# Patient Record
Sex: Male | Born: 1985 | Race: Black or African American | Hispanic: No | Marital: Single | Smoking: Never smoker
Health system: Southern US, Community
[De-identification: ages and names within clinical notes are randomized; demographics above are authoritative.]

## PROBLEM LIST (undated history)

## (undated) HISTORY — PX: HERNIA REPAIR: SHX51

---
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
## 2007-05-09 ENCOUNTER — Emergency Department (HOSPITAL_COMMUNITY): Admission: EM | Admit: 2007-05-09 | Discharge: 2007-05-09 | Payer: Self-pay | Admitting: Emergency Medicine

## 2009-03-09 ENCOUNTER — Emergency Department (HOSPITAL_COMMUNITY): Admission: EM | Admit: 2009-03-09 | Discharge: 2009-03-09 | Payer: Self-pay | Admitting: Emergency Medicine

## 2009-06-04 ENCOUNTER — Emergency Department (HOSPITAL_COMMUNITY): Admission: EM | Admit: 2009-06-04 | Discharge: 2009-06-04 | Payer: Self-pay | Admitting: Emergency Medicine

## 2009-09-26 ENCOUNTER — Emergency Department (HOSPITAL_COMMUNITY): Admission: EM | Admit: 2009-09-26 | Discharge: 2009-09-26 | Payer: Self-pay | Admitting: Emergency Medicine

## 2010-05-20 LAB — GC/CHLAMYDIA PROBE AMP, GENITAL
Chlamydia, DNA Probe: NEGATIVE
GC Probe Amp, Genital: NEGATIVE

## 2010-05-20 LAB — RPR: RPR Ser Ql: NONREACTIVE

## 2010-07-17 NOTE — Op Note (Signed)
[place]. [facility]  Patient:    Howard Hamilton, Howard Hamilton Visit Number: [id number] MRN: [id number]          Service Type: DSU Location: [facility] Attending Physician:  Leonia Corona Dictated by:   Judie Petit. Leonia Corona, M.D. Proc. Date: 09/07/01 Admit Date:  09/07/2001 Discharge Date: 09/07/2001                             Operative Report  PREOPERATIVE DIAGNOSIS:  Umbilical hernia.  POSTOPERATIVE DIAGNOSIS:  Umbilical hernia.  PROCEDURE PERFORMED:  Repair of umbilical hernia.  ANESTHESIA:  General laryngeal mask.  SURGEON:  Nelida Meuse, M.D.  ASSISTANT:  Nurse.  PROCEDURE IN DETAIL:  The patient is brought to the operating room and placed supine on the operating table.  General laryngeal mask anesthesia is given. The umbilicus and surrounding area was cleaned, prepped and draped in the usual manner.  Approximately 5 cc of 0.25% Marcaine with epinephrine was infiltrated along the line the incision and his umbilical area.  The incision was made with a knife, measuring about 1-2 cm.  The center of the umbilical skin was held up with a Towe, fascillitating the incision and further subsequent dissection into subcutaneous plane.  With the help of scissors, sharp and blunt dissection was done around the umbilical sac, going around circumferentially. We were able to pass the hemostats on one side of the hernial sac to the opposite, running above the sac.  The sac was opened and edges of the hernia divided and the sac was held up with multiple hemostats.  A large defect measuring more than 2 cm was found.  The hernia sac was dissected in the subcu plane until the umbilical ring, at which point it was repaired using 4-0 stainless steel wire.  A transverse mattress suture was placed after tying, a vent secured inverted this area was obtained.  Was further secured using 2-0 Vicryl.  Stitches were placed in between the stainless steel wire sutures. Oozing and  bleeding spots were cauterized.  The distal part of the sac still attached to the undersurface of the umbilical skin was excised completely with the help of sharp scissors.  Oozing and bleeding spots were cauterized.  The wound was irrigated and then the umbilicus was recreated by tucking the center of the umbilicus into the center of the fascial repair using 00 Vicryl. Further sites were infiltrated with 0.25% Marcaine with epinephrine in and around the incision.  Postoperative in control.  The wound was now closed in two layers.  The deep subcutaneous layer using 4-0 Vicryl subcuticular stitches and 5-0 Monocryl subcuticular stitch.  Steri-Strips were applied. This was followed with a sterile gauze and tape.  The patient tolerated the procedure well.  The patient was extubated and transported to recovery room in good condition. Dictated by:   Judie Petit. Leonia Corona, M.D. Attending Physician:  Leonia Corona DD:  09/07/01 TD:  09/10/01 Job: [id number]

## 2013-01-01 ENCOUNTER — Emergency Department (HOSPITAL_COMMUNITY)
Admission: EM | Admit: 2013-01-01 | Discharge: 2013-01-01 | Disposition: A | Discharge status: Home / Self Care | Payer: Self-pay | Attending: Emergency Medicine | Admitting: Emergency Medicine

## 2013-01-01 ENCOUNTER — Encounter (HOSPITAL_COMMUNITY): Payer: Self-pay | Admitting: Emergency Medicine

## 2013-01-01 DIAGNOSIS — K029 Dental caries, unspecified: Secondary | ICD-10-CM | POA: Insufficient documentation

## 2013-01-01 DIAGNOSIS — F17200 Nicotine dependence, unspecified, uncomplicated: Secondary | ICD-10-CM | POA: Insufficient documentation

## 2013-01-01 MED ORDER — TRAMADOL HCL 50 MG PO TABS: 50.0000 mg | ORAL_TABLET | Freq: Four times a day (QID) | ORAL | Status: DC | PRN

## 2013-01-01 NOTE — ED Provider Notes (Signed)
CSN: [id number]     Arrival date & time 01/01/13  1849 History   First MD Initiated Contact with Patient 01/01/13 2002     Chief Complaint  Patient presents with  . Dental Pain   (Consider location/radiation/quality/duration/timing/severity/associated sxs/prior Treatment) HPI Comments: Patient revisit presents with right lower molar pain for 2 months yesterday portion of the tooth is chipped off.  He's had increased pain since that, time.  He's been taking ibuprofen, and Tylenol with moderate relief.  Has no dentist.  Has no dental insurance  Patient is a 27 y.o. male presenting with tooth pain. The history is provided by the patient.  Dental Pain Location:  Lower Lower teeth location:  31/RL 2nd molar Quality:  Aching and constant Severity:  Moderate Onset quality:  Gradual Timing:  Constant Progression:  Worsening Chronicity:  New Context: crown fracture   Relieved by:  NSAIDs Worsened by:  Cold food/drink Associated symptoms: no facial pain, no facial swelling, no fever, no gum swelling, no headaches, no oral lesions and no trismus   Risk factors: no diabetes and no periodontal disease     History reviewed. No pertinent past medical history. History reviewed. No pertinent past surgical history. No family history on file. History  Substance Use Topics  . Smoking status: Current Every Day Smoker  . Smokeless tobacco: Not on file  . Alcohol Use: Yes    Review of Systems  Constitutional: Negative for fever.  HENT: Positive for dental problem. Negative for facial swelling and mouth sores.   Neurological: Negative for headaches.  All other systems reviewed and are negative.    Allergies  Review of patient's allergies indicates no known allergies.  Home Medications   Current Outpatient Rx  Name  Route  Sig  Dispense  Refill  . traMADol (ULTRAM) 50 MG tablet   Oral   Take 1 tablet (50 mg total) by mouth every 6 (six) hours as needed for pain.   15 tablet   0     BP 135/78  Pulse 56  Temp(Src) 98 F (36.7 C) (Oral)  Resp 16  Wt 184 lb 8 oz (83.689 kg)  SpO2 100% Physical Exam  Nursing note and vitals reviewed. Constitutional: He appears well-developed and well-nourished.  HENT:  Head: Normocephalic.  Mouth/Throat:    Eyes: Pupils are equal, round, and reactive to light.  Neck: Normal range of motion.  Cardiovascular: Normal rate and regular rhythm.   Lymphadenopathy:    He has no cervical adenopathy.  Neurological: He is alert.  Skin: Skin is warm.    ED Course  Dental Date/Time: 01/01/2013 8:17 PM Performed by: Arman Filter Authorized by: Arman Filter Consent: Verbal consent obtained. written consent not obtained. Risks and benefits: risks, benefits and alternatives were discussed Consent given by: patient Patient understanding: patient states understanding of the procedure being performed Patient identity confirmed: verbally with patient Time out: Immediately prior to procedure a "time out" was called to verify the correct patient, procedure, equipment, support staff and site/side marked as required. Local anesthesia used: yes Local anesthetic: bupivacaine 0.5% with epinephrine Anesthetic total: 0.5 ml Patient sedated: no Patient tolerance: Patient tolerated the procedure well with no immediate complications.   (including critical care time) Labs Review Labs Reviewed - No data to display Imaging Review No results found.  EKG Interpretation   None       MDM   1. Dental caries    Patient did get relief from his dental, block.  He's  been referred to a dentist for followup in the morning.  He's also been given a prescription for Ultram, and instructed to start taking the medication when he starts feeling.  The tingling when the, medication wears off     Arman Filter, NP 01/01/13 2039

## 2013-01-01 NOTE — ED Notes (Signed)
Patient presents with right lower molar pain x 2 months and reports part of the molar chipped off yesterday.  Patient has not seen a dentist because he does not have insurance.  Patient reports taking ibuprofen and tylenol with temporary relief pain.

## 2013-01-01 NOTE — ED Provider Notes (Signed)
Medical screening examination/treatment/procedure(s) were performed by non-physician practitioner and as supervising physician I was immediately available for consultation/collaboration.  Flint Melter, MD 01/01/13 2209

## 2014-02-05 ENCOUNTER — Encounter (HOSPITAL_COMMUNITY): Payer: Self-pay | Admitting: *Deleted

## 2014-02-05 ENCOUNTER — Emergency Department (HOSPITAL_COMMUNITY): Payer: Self-pay

## 2014-02-05 ENCOUNTER — Emergency Department (HOSPITAL_COMMUNITY)
Admission: EM | Admit: 2014-02-05 | Discharge: 2014-02-05 | Disposition: A | Discharge status: Home / Self Care | Payer: Self-pay | Attending: Emergency Medicine | Admitting: Emergency Medicine

## 2014-02-05 DIAGNOSIS — Y998 Other external cause status: Secondary | ICD-10-CM | POA: Insufficient documentation

## 2014-02-05 DIAGNOSIS — S0990XA Unspecified injury of head, initial encounter: Secondary | ICD-10-CM | POA: Insufficient documentation

## 2014-02-05 DIAGNOSIS — S0591XA Unspecified injury of right eye and orbit, initial encounter: Secondary | ICD-10-CM | POA: Insufficient documentation

## 2014-02-05 DIAGNOSIS — Y9339 Activity, other involving climbing, rappelling and jumping off: Secondary | ICD-10-CM | POA: Insufficient documentation

## 2014-02-05 DIAGNOSIS — Z720 Tobacco use: Secondary | ICD-10-CM | POA: Insufficient documentation

## 2014-02-05 DIAGNOSIS — S022XXA Fracture of nasal bones, initial encounter for closed fracture: Secondary | ICD-10-CM | POA: Insufficient documentation

## 2014-02-05 DIAGNOSIS — Y9289 Other specified places as the place of occurrence of the external cause: Secondary | ICD-10-CM | POA: Insufficient documentation

## 2014-02-05 MED ORDER — HYDROCODONE-ACETAMINOPHEN 5-325 MG PO TABS: 1.0000 | ORAL_TABLET | ORAL | Status: DC | PRN

## 2014-02-05 MED ORDER — HYDROCODONE-ACETAMINOPHEN 5-325 MG PO TABS
1.0000 | ORAL_TABLET | Freq: Once | ORAL | Status: AC
Start: 2014-02-05 — End: 2014-02-05
  Administered 2014-02-05: 1 via ORAL
  Filled 2014-02-05: qty 1
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
## 2014-02-05 NOTE — ED Notes (Signed)
Pt back from CT

## 2014-02-05 NOTE — Discharge Instructions (Signed)
Concussion A concussion, or closed-head injury, is a brain injury caused by a direct blow to the head or by a quick and sudden movement (jolt) of the head or neck. Concussions are usually not life-threatening. Even so, the effects of a concussion can be serious. If you have had a concussion before, you are more likely to experience concussion-like symptoms after a direct blow to the head.  CAUSES  Direct blow to the head, such as from running into another player during a soccer game, being hit in a fight, or hitting your head on a hard surface.  A jolt of the head or neck that causes the brain to move back and forth inside the skull, such as in a car crash. SIGNS AND SYMPTOMS The signs of a concussion can be hard to notice. Early on, they may be missed by you, family members, and health care providers. You may look fine but act or feel differently. Symptoms are usually temporary, but they may last for days, weeks, or even longer. Some symptoms may appear right away while others may not show up for hours or days. Every head injury is different. Symptoms include:  Mild to moderate headaches that will not go away.  A feeling of pressure inside your head.  Having more trouble than usual:  Learning or remembering things you have heard.  Answering questions.  Paying attention or concentrating.  Organizing daily tasks.  Making decisions and solving problems.  Slowness in thinking, acting or reacting, speaking, or reading.  Getting lost or being easily confused.  Feeling tired all the time or lacking energy (fatigued).  Feeling drowsy.  Sleep disturbances.  Sleeping more than usual.  Sleeping less than usual.  Trouble falling asleep.  Trouble sleeping (insomnia).  Loss of balance or feeling lightheaded or dizzy.  Nausea or vomiting.  Numbness or tingling.  Increased sensitivity to:  Sounds.  Lights.  Distractions.  Vision problems or eyes that tire  easily.  Diminished sense of taste or smell.  Ringing in the ears.  Mood changes such as feeling sad or anxious.  Becoming easily irritated or angry for little or no reason.  Lack of motivation.  Seeing or hearing things other people do not see or hear (hallucinations). DIAGNOSIS Your health care provider can usually diagnose a concussion based on a description of your injury and symptoms. He or she will ask whether you passed out (lost consciousness) and whether you are having trouble remembering events that happened right before and during your injury. Your evaluation might include:  A brain scan to look for signs of injury to the brain. Even if the test shows no injury, you may still have a concussion.  Blood tests to be sure other problems are not present. TREATMENT  Concussions are usually treated in an emergency department, in urgent care, or at a clinic. You may need to stay in the hospital overnight for further treatment.  Tell your health care provider if you are taking any medicines, including prescription medicines, over-the-counter medicines, and natural remedies. Some medicines, such as blood thinners (anticoagulants) and aspirin, may increase the chance of complications. Also tell your health care provider whether you have had alcohol or are taking illegal drugs. This information may affect treatment.  Your health care provider will send you home with important instructions to follow.  How fast you will recover from a concussion depends on many factors. These factors include how severe your concussion is, what part of your brain was injured, your  age, and how healthy you were before the concussion. °· Most people with mild injuries recover fully. Recovery can take time. In general, recovery is slower in older persons. Also, persons who have had a concussion in the past or have other medical problems may find that it takes longer to recover from their current injury. °HOME  CARE INSTRUCTIONS °General Instructions °· Carefully follow the directions your health care provider gave you. °· Only take over-the-counter or prescription medicines for pain, discomfort, or fever as directed by your health care provider. °· Take only those medicines that your health care provider has approved. °· Do not drink alcohol until your health care provider says you are well enough to do so. Alcohol and certain other drugs may slow your recovery and can put you at risk of further injury. °· If it is harder than usual to remember things, write them down. °· If you are easily distracted, try to do one thing at a time. For example, do not try to watch TV while fixing dinner. °· Talk with family members or close friends when making important decisions. °· Keep all follow-up appointments. Repeated evaluation of your symptoms is recommended for your recovery. °· Watch your symptoms and tell others to do the same. Complications sometimes occur after a concussion. Older adults with a brain injury may have a higher risk of serious complications, such as a blood clot on the brain. °· Tell your teachers, school nurse, school counselor, coach, athletic trainer, or work manager about your injury, symptoms, and restrictions. Tell them about what you can or cannot do. They should watch for: °¨ Increased problems with attention or concentration. °¨ Increased difficulty remembering or learning new information. °¨ Increased time needed to complete tasks or assignments. °¨ Increased irritability or decreased ability to cope with stress. °¨ Increased symptoms. °· Rest. Rest helps the brain to heal. Make sure you: °¨ Get plenty of sleep at night. Avoid staying up late at night. °¨ Keep the same bedtime hours on weekends and weekdays. °¨ Rest during the day. Take daytime naps or rest breaks when you feel tired. °· Limit activities that require a lot of thought or concentration. These include: °¨ Doing homework or job-related  work. °¨ Watching TV. °¨ Working on the computer. °· Avoid any situation where there is potential for another head injury (football, hockey, soccer, basketball, martial arts, downhill snow sports and horseback riding). Your condition will get worse every time you experience a concussion. You should avoid these activities until you are evaluated by the appropriate follow-up health care providers. °Returning To Your Regular Activities °You will need to return to your normal activities slowly, not all at once. You must give your body and brain enough time for recovery. °· Do not return to sports or other athletic activities until your health care provider tells you it is safe to do so. °· Ask your health care provider when you can drive, ride a bicycle, or operate heavy machinery. Your ability to react may be slower after a brain injury. Never do these activities if you are dizzy. °· Ask your health care provider about when you can return to work or school. °Preventing Another Concussion °It is very important to avoid another brain injury, especially before you have recovered. In rare cases, another injury can lead to permanent brain damage, brain swelling, or death. The risk of this is greatest during the first 7-10 days after a head injury. Avoid injuries by: °· Wearing a seat   belt when riding in a car.  Drinking alcohol only in moderation.  Wearing a helmet when biking, skiing, skateboarding, skating, or doing similar activities.  Avoiding activities that could lead to a second concussion, such as contact or recreational sports, until your health care provider says it is okay.  Taking safety measures in your home.  Remove clutter and tripping hazards from floors and stairways.  Use grab bars in bathrooms and handrails by stairs.  Place non-slip mats on floors and in bathtubs.  Improve lighting in dim areas. SEEK MEDICAL CARE IF:  You have increased problems paying attention or  concentrating.  You have increased difficulty remembering or learning new information.  You need more time to complete tasks or assignments than before.  You have increased irritability or decreased ability to cope with stress.  You have more symptoms than before. Seek medical care if you have any of the following symptoms for more than 2 weeks after your injury:  Lasting (chronic) headaches.  Dizziness or balance problems.  Nausea.  Vision problems.  Increased sensitivity to noise or light.  Depression or mood swings.  Anxiety or irritability.  Memory problems.  Difficulty concentrating or paying attention.  Sleep problems.  Feeling tired all the time. SEEK IMMEDIATE MEDICAL CARE IF:  You have severe or worsening headaches. These may be a sign of a blood clot in the brain.  You have weakness (even if only in one hand, leg, or part of the face).  You have numbness.  You have decreased coordination.  You vomit repeatedly.  You have increased sleepiness.  One pupil is larger than the other.  You have convulsions.  You have slurred speech.  You have increased confusion. This may be a sign of a blood clot in the brain.  You have increased restlessness, agitation, or irritability.  You are unable to recognize people or places.  You have neck pain.  It is difficult to wake you up.  You have unusual behavior changes.  You lose consciousness. MAKE SURE YOU:  Understand these instructions.  Will watch your condition.  Will get help right away if you are not doing well or get worse. Document Released: 05/08/2003 Document Revised: 02/20/2013 Document Reviewed: 09/07/2012 [facility] Patient Information 2015 [place], [place]. This information is not intended to replace advice given to you by your health care provider. Make sure you discuss any questions you have with your health care provider.  Nasal Fracture A nasal fracture is a break or crack in the  bones of the nose. A minor break usually heals in a month. You often will receive black eyes from a nasal fracture. This is not a cause for concern. The black eyes will go away over 1 to 2 weeks.  DIAGNOSIS  Your caregiver may want to examine you if you are concerned about a fracture of the nose. X-rays of the nose may not show a nasal fracture even when one is present. Sometimes your caregiver must wait 1 to 5 days after the injury to re-check the nose for alignment and to take additional X-rays. Sometimes the caregiver must wait until the swelling has gone down. TREATMENT Minor fractures that have caused no deformity often do not require treatment. More serious fractures where bones are displaced may require surgery. This will take place after the swelling is gone. Surgery will stabilize and align the fracture. HOME CARE INSTRUCTIONS   Put ice on the injured area.  Put ice in a plastic bag.  Place a  towel between your skin and the bag.  Leave the ice on for 15-20 minutes, 03-04 times a day.  Take medications as directed by your caregiver.  Only take over-the-counter or prescription medicines for pain, discomfort, or fever as directed by your caregiver.  If your nose starts bleeding, squeeze the soft parts of the nose against the center wall while you are sitting in an upright position for 10 minutes.  Contact sports should be avoided for at least 3 to 4 weeks or as directed by your caregiver. SEEK MEDICAL CARE IF:  Your pain increases or becomes severe.  You continue to have nosebleeds.  The shape of your nose does not return to normal within 5 days.  You have pus draining from the nose. SEEK IMMEDIATE MEDICAL CARE IF:   You have bleeding from your nose that does not stop after 20 minutes of pinching the nostrils closed and keeping ice on the nose.  You have clear fluid draining from your nose.  You notice a grape-like swelling on the dividing wall between the nostrils  (septum). This is a collection of blood (hematoma) that must be drained to help prevent infection.  You have difficulty moving your eyes.  You have recurrent vomiting. Document Released: 02/13/2000 Document Revised: 05/10/2011 Document Reviewed: 06/01/2010 [facility] Patient Information 2015 [place], [place]. This information is not intended to replace advice given to you by your health care provider. Make sure you discuss any questions you have with your health care provider.
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
## 2014-02-05 NOTE — ED Notes (Signed)
Patient transported to CT 

## 2014-02-07 NOTE — ED Notes (Signed)
Per dr Deretha Emoryzackowski , okay to extend note until the 12th.

## 2015-04-08 ENCOUNTER — Ambulatory Visit: Payer: Self-pay | Admitting: Internal Medicine

## 2015-05-16 ENCOUNTER — Other Ambulatory Visit (INDEPENDENT_AMBULATORY_CARE_PROVIDER_SITE_OTHER): Payer: Managed Care, Other (non HMO)

## 2015-05-16 ENCOUNTER — Ambulatory Visit (INDEPENDENT_AMBULATORY_CARE_PROVIDER_SITE_OTHER): Payer: Managed Care, Other (non HMO) | Admitting: Internal Medicine

## 2015-05-16 ENCOUNTER — Encounter: Payer: Self-pay | Admitting: Internal Medicine

## 2015-05-16 VITALS — BP 120/70 | HR 66 | Temp 98.1°F | Resp 16 | Ht 72.0 in | Wt 173.0 lb

## 2015-05-16 DIAGNOSIS — Z0000 Encounter for general adult medical examination without abnormal findings: Secondary | ICD-10-CM | POA: Diagnosis not present

## 2015-05-16 DIAGNOSIS — Z720 Tobacco use: Secondary | ICD-10-CM

## 2015-05-16 DIAGNOSIS — Z23 Encounter for immunization: Secondary | ICD-10-CM | POA: Diagnosis not present

## 2015-05-16 LAB — CBC
HCT: 43.1 % (ref 39.0–52.0)
Hemoglobin: 14.1 g/dL (ref 13.0–17.0)
MCHC: 32.8 g/dL (ref 30.0–36.0)
MCV: 84.9 fl (ref 78.0–100.0)
PLATELETS: 248 10*3/uL (ref 150.0–400.0)
RBC: 5.07 Mil/uL (ref 4.22–5.81)
RDW: 14 % (ref 11.5–15.5)
WBC: 5.1 10*3/uL (ref 4.0–10.5)

## 2015-05-16 LAB — COMPREHENSIVE METABOLIC PANEL
ALT: 11 U/L (ref 0–53)
AST: 13 U/L (ref 0–37)
Albumin: 4.1 g/dL (ref 3.5–5.2)
Alkaline Phosphatase: 71 U/L (ref 39–117)
BILIRUBIN TOTAL: 0.8 mg/dL (ref 0.2–1.2)
BUN: 15 mg/dL (ref 6–23)
CALCIUM: 9.6 mg/dL (ref 8.4–10.5)
CHLORIDE: 105 meq/L (ref 96–112)
CO2: 31 meq/L (ref 19–32)
Creatinine, Ser: 0.95 mg/dL (ref 0.40–1.50)
GFR: 121.72 mL/min (ref >60.00)
GLUCOSE: 91 mg/dL (ref 70–99)
POTASSIUM: 3.9 meq/L (ref 3.5–5.1)
Sodium: 140 mEq/L (ref 135–145)
Total Protein: 7.1 g/dL (ref 6.0–8.3)

## 2015-05-16 LAB — LIPID PANEL
CHOL/HDL RATIO: 2
Cholesterol: 106 mg/dL (ref 0–200)
HDL: 48.4 mg/dL (ref >39.00)
LDL CALC: 46 mg/dL (ref 0–99)
NONHDL: 57.6
TRIGLYCERIDES: 60 mg/dL (ref 0.0–149.0)
VLDL: 12 mg/dL (ref 0.0–40.0)

## 2015-05-16 NOTE — Assessment & Plan Note (Signed)
Advised glucosamine and tumeric for the joints as well as staying active. Checking labs and adjust as needed. Current 1 black and mild per day and advised to quit. Talked to him about screening recommendations.

## 2015-05-16 NOTE — Assessment & Plan Note (Signed)
Smokes 1-2 black and mild daily at his job. Talked to him about the long term health risks from smoking. He is aware and is not thinking about quitting right now. Encouraged him to think about this.

## 2015-05-16 NOTE — Patient Instructions (Signed)
We will check the labs today and call you back. We call you back even if everything is normal so don't be worried if we call.   Tumeric is good for the joints to help as well as glucosamine and condroitin to help prevent arthritis in the future. The best thing to do is to stay active to help keep the joints healthy.   Health Maintenance, Male A healthy lifestyle and preventative care can promote health and wellness.  Maintain regular health, dental, and eye exams.  Eat a healthy diet. Foods like vegetables, fruits, whole grains, low-fat dairy products, and lean protein foods contain the nutrients you need and are low in calories. Decrease your intake of foods high in solid fats, added sugars, and salt. Get information about a proper diet from your health care provider, if necessary.  Regular physical exercise is one of the most important things you can do for your health. Most adults should get at least 150 minutes of moderate-intensity exercise (any activity that increases your heart rate and causes you to sweat) each week. In addition, most adults need muscle-strengthening exercises on 2 or more days a week.   Maintain a healthy weight. The body mass index (BMI) is a screening tool to identify possible weight problems. It provides an estimate of body fat based on height and weight. Your health care provider can find your BMI and can help you achieve or maintain a healthy weight. For males 20 years and older:  A BMI below 18.5 is considered underweight.  A BMI of 18.5 to 24.9 is normal.  A BMI of 25 to 29.9 is considered overweight.  A BMI of 30 and above is considered obese.  Maintain normal blood lipids and cholesterol by exercising and minimizing your intake of saturated fat. Eat a balanced diet with plenty of fruits and vegetables. Blood tests for lipids and cholesterol should begin at age 28 and be repeated every 5 years. If your lipid or cholesterol levels are high, you are over age  28, or you are at high risk for heart disease, you may need your cholesterol levels checked more frequently.Ongoing high lipid and cholesterol levels should be treated with medicines if diet and exercise are not working.  If you smoke, find out from your health care provider how to quit. If you do not use tobacco, do not start.  Lung cancer screening is recommended for adults aged 55-80 years who are at high risk for developing lung cancer because of a history of smoking. A yearly low-dose CT scan of the lungs is recommended for people who have at least a 30-pack-year history of smoking and are current smokers or have quit within the past 15 years. A pack year of smoking is smoking an average of 1 pack of cigarettes a day for 1 year (for example, a 30-pack-year history of smoking could mean smoking 1 pack a day for 30 years or 2 packs a day for 15 years). Yearly screening should continue until the smoker has stopped smoking for at least 15 years. Yearly screening should be stopped for people who develop a health problem that would prevent them from having lung cancer treatment.  If you choose to drink alcohol, do not have more than 2 drinks per day. One drink is considered to be 12 oz (360 mL) of beer, 5 oz (150 mL) of wine, or 1.5 oz (45 mL) of liquor.  Avoid the use of street drugs. Do not share needles with anyone. Ask  for help if you need support or instructions about stopping the use of drugs.  High blood pressure causes heart disease and increases the risk of stroke. High blood pressure is more likely to develop in:  People who have blood pressure in the end of the normal range (100-139/85-89 mm Hg).  People who are overweight or obese.  People who are African American.  If you are 82-50 years of age, have your blood pressure checked every 3-5 years. If you are 61 years of age or older, have your blood pressure checked every year. You should have your blood pressure measured twice--once when  you are at a hospital or clinic, and once when you are not at a hospital or clinic. Record the average of the two measurements. To check your blood pressure when you are not at a hospital or clinic, you can use:  An automated blood pressure machine at a pharmacy.  A home blood pressure monitor.  If you are 56-5 years old, ask your health care provider if you should take aspirin to prevent heart disease.  Diabetes screening involves taking a blood sample to check your fasting blood sugar level. This should be done once every 3 years after age 68 if you are at a normal weight and without risk factors for diabetes. Testing should be considered at a younger age or be carried out more frequently if you are overweight and have at least 1 risk factor for diabetes.  Colorectal cancer can be detected and often prevented. Most routine colorectal cancer screening begins at the age of 63 and continues through age 25. However, your health care provider may recommend screening at an earlier age if you have risk factors for colon cancer. On a yearly basis, your health care provider may provide home test kits to check for hidden blood in the stool. A small camera at the end of a tube may be used to directly examine the colon (sigmoidoscopy or colonoscopy) to detect the earliest forms of colorectal cancer. Talk to your health care provider about this at age 26 when routine screening begins. A direct exam of the colon should be repeated every 5-10 years through age 25, unless early forms of precancerous polyps or small growths are found.  People who are at an increased risk for hepatitis B should be screened for this virus. You are considered at high risk for hepatitis B if:  You were born in a country where hepatitis B occurs often. Talk with your health care provider about which countries are considered high risk.  Your parents were born in a high-risk country and you have not received a shot to protect against  hepatitis B (hepatitis B vaccine).  You have HIV or AIDS.  You use needles to inject street drugs.  You live with, or have sex with, someone who has hepatitis B.  You are a man who has sex with other men (MSM).  You get hemodialysis treatment.  You take certain medicines for conditions like cancer, organ transplantation, and autoimmune conditions.  Hepatitis C blood testing is recommended for all people born from [birth year] through 1963 and any individual with known risk factors for hepatitis C.  Healthy men should no longer receive prostate-specific antigen (PSA) blood tests as part of routine cancer screening. Talk to your health care provider about prostate cancer screening.  Testicular cancer screening is not recommended for adolescents or adult males who have no symptoms. Screening includes self-exam, a health care provider exam, and other  screening tests. Consult with your health care provider about any symptoms you have or any concerns you have about testicular cancer.  Practice safe sex. Use condoms and avoid high-risk sexual practices to reduce the spread of sexually transmitted infections (STIs).  You should be screened for STIs, including gonorrhea and chlamydia if:  You are sexually active and are younger than 24 years.  You are older than 24 years, and your health care provider tells you that you are at risk for this type of infection.  Your sexual activity has changed since you were last screened, and you are at an increased risk for chlamydia or gonorrhea. Ask your health care provider if you are at risk.  If you are at risk of being infected with HIV, it is recommended that you take a prescription medicine daily to prevent HIV infection. This is called pre-exposure prophylaxis (PrEP). You are considered at risk if:  You are a man who has sex with other men (MSM).  You are a heterosexual man who is sexually active with multiple partners.  You take drugs by  injection.  You are sexually active with a partner who has HIV.  Talk with your health care provider about whether you are at high risk of being infected with HIV. If you choose to begin PrEP, you should first be tested for HIV. You should then be tested every 3 months for as long as you are taking PrEP.  Use sunscreen. Apply sunscreen liberally and repeatedly throughout the day. You should seek shade when your shadow is shorter than you. Protect yourself by wearing long sleeves, pants, a wide-brimmed hat, and sunglasses year round whenever you are outdoors.  Tell your health care provider of new moles or changes in moles, especially if there is a change in shape or color. Also, tell your health care provider if a mole is larger than the size of a pencil eraser.  A one-time screening for abdominal aortic aneurysm (AAA) and surgical repair of large AAAs by ultrasound is recommended for men aged 48-75 years who are current or former smokers.  Stay current with your vaccines (immunizations).   This information is not intended to replace advice given to you by your health care provider. Make sure you discuss any questions you have with your health care provider.   Document Released: 08/14/2007 Document Revised: 03/08/2014 Document Reviewed: 07/13/2010 Elsevier Interactive Patient Education [employer].

## 2015-05-16 NOTE — Progress Notes (Signed)
Pre visit review using our clinic review tool, if applicable. No additional management support is needed unless otherwise documented below in the visit note. 

## 2015-05-16 NOTE — Progress Notes (Signed)
   Subjective:    Patient ID: Howard Hamilton, male    DOB: 1985/04/28, 28 y.o.   MRN: [id number]  HPI The patient is a new 30 YO man coming in for wellness wanting to know how to keep his knees in good shape for the future. He did play a lot of basketball and is worried about arthritis down the road. No complaints or concerns.   [facility], [facility], social history reviewed and updated.   Review of Systems  Constitutional: Negative for fever, activity change, appetite change, fatigue and unexpected weight change.  HENT: Negative.   Eyes: Negative.   Respiratory: Negative for cough, chest tightness, shortness of breath and wheezing.   Cardiovascular: Negative for chest pain, palpitations and leg swelling.  Gastrointestinal: Negative for nausea, abdominal pain, diarrhea, constipation and abdominal distention.  Musculoskeletal: Negative.   Skin: Negative.   Neurological: Negative.   Psychiatric/Behavioral: Negative.       Objective:   Physical Exam  Constitutional: He is oriented to person, place, and time. He appears well-developed and well-nourished.  HENT:  Head: Normocephalic and atraumatic.  Eyes: EOM are normal.  Neck: Normal range of motion.  Cardiovascular: Normal rate and regular rhythm.   Pulmonary/Chest: Effort normal and breath sounds normal. No respiratory distress. He has no wheezes. He has no rales.  Abdominal: Soft. Bowel sounds are normal. He exhibits no distension. There is no tenderness. There is no rebound.  Musculoskeletal: He exhibits no edema.  Neurological: He is alert and oriented to person, place, and time. Coordination normal.  Skin: Skin is warm and dry.  Psychiatric: He has a normal mood and affect.   Filed Vitals:   05/16/15 0903  BP: 120/70  Pulse: 66  Temp: 98.1 F (36.7 C)  TempSrc: Oral  Resp: 16  Height: 6' (1.829 m)  Weight: 173 lb (78.472 kg)  SpO2: 98%      Assessment & Plan:  Tdap given at visit

## 2015-05-16 NOTE — Addendum Note (Signed)
Addended by: Conception ChancyOBERSON, AMY R on: 05/16/2015 10:58 AM   Modules accepted: Orders

## 2015-05-17 LAB — HIV ANTIBODY (ROUTINE TESTING W REFLEX): HIV: NONREACTIVE

## 2018-02-11 ENCOUNTER — Emergency Department (HOSPITAL_COMMUNITY)
Admission: EM | Admit: 2018-02-11 | Discharge: 2018-02-11 | Disposition: A | Discharge status: Home / Self Care | Payer: BLUE CROSS/BLUE SHIELD | Attending: Emergency Medicine | Admitting: Emergency Medicine

## 2018-02-11 ENCOUNTER — Encounter (HOSPITAL_COMMUNITY): Payer: Self-pay | Admitting: Emergency Medicine

## 2018-02-11 DIAGNOSIS — Y929 Unspecified place or not applicable: Secondary | ICD-10-CM | POA: Insufficient documentation

## 2018-02-11 DIAGNOSIS — W2209XA Striking against other stationary object, initial encounter: Secondary | ICD-10-CM | POA: Diagnosis not present

## 2018-02-11 DIAGNOSIS — S161XXA Strain of muscle, fascia and tendon at neck level, initial encounter: Secondary | ICD-10-CM | POA: Diagnosis present

## 2018-02-11 DIAGNOSIS — F17210 Nicotine dependence, cigarettes, uncomplicated: Secondary | ICD-10-CM | POA: Insufficient documentation

## 2018-02-11 DIAGNOSIS — Y9389 Activity, other specified: Secondary | ICD-10-CM | POA: Insufficient documentation

## 2018-02-11 DIAGNOSIS — Y999 Unspecified external cause status: Secondary | ICD-10-CM | POA: Diagnosis not present

## 2018-02-11 MED ORDER — METHOCARBAMOL 500 MG PO TABS: 500.0000 mg | ORAL_TABLET | Freq: Two times a day (BID) | ORAL | 0 refills | Status: DC
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
## 2018-02-11 NOTE — Discharge Instructions (Addendum)
Please read attached information. If you experience any new or worsening signs or symptoms please return to the emergency room for evaluation. Please follow-up with your primary care provider or specialist as discussed. Please use medication prescribed only as directed and discontinue taking if you have any concerning signs or symptoms.   °

## 2018-02-11 NOTE — ED Notes (Signed)
Discharge instructions and prescription discussed with Pt. Pt verbalized understanding. Pt stable and ambulatory.    
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
## 2018-02-14 ENCOUNTER — Encounter: Payer: Self-pay | Admitting: Internal Medicine

## 2018-02-14 ENCOUNTER — Ambulatory Visit (INDEPENDENT_AMBULATORY_CARE_PROVIDER_SITE_OTHER): Payer: BLUE CROSS/BLUE SHIELD | Admitting: Internal Medicine

## 2018-02-14 VITALS — BP 120/80 | HR 68 | Temp 99.1°F | Ht 72.0 in | Wt 167.0 lb

## 2018-02-14 DIAGNOSIS — M542 Cervicalgia: Secondary | ICD-10-CM | POA: Diagnosis not present

## 2018-02-14 DIAGNOSIS — R110 Nausea: Secondary | ICD-10-CM | POA: Insufficient documentation

## 2018-02-14 DIAGNOSIS — J3489 Other specified disorders of nose and nasal sinuses: Secondary | ICD-10-CM | POA: Diagnosis not present

## 2018-02-14 DIAGNOSIS — M545 Low back pain, unspecified: Secondary | ICD-10-CM | POA: Insufficient documentation

## 2018-02-14 MED ORDER — MELOXICAM 15 MG PO TABS: 15.0000 mg | ORAL_TABLET | Freq: Every day | ORAL | 0 refills | Status: DC

## 2018-02-14 MED ORDER — METHYLPREDNISOLONE ACETATE 80 MG/ML IJ SUSP
80.0000 mg | Freq: Once | INTRAMUSCULAR | Status: AC
  Administered 2018-02-14: 80 mg via INTRAMUSCULAR

## 2018-02-14 NOTE — Assessment & Plan Note (Signed)
Depo-medrol 80 mg IM given at visit. Rx for meloxicam and encouraged tylenol as well and heating pads. We talked about possible 2-4 weeks for healing time.

## 2018-02-14 NOTE — Patient Instructions (Signed)
[facility] and you can go back sooner if needed.   We have sent in meloxicam to take daily for pain. You can use tylenol on top of this if needed.   Heat generally feels good for this so warm shower or heating pad may help also.
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
## 2018-02-14 NOTE — Addendum Note (Signed)
Addended by: Berton LanGULCH, Ellarie Picking R on: 02/14/2018 09:32 AM   Modules accepted: Orders
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
## 2018-02-14 NOTE — Assessment & Plan Note (Signed)
Likely mild concussion symptoms without other symptoms such as headache or vision changes or confusion. Does not require nausea medication. Asked if progression to call back.

## 2018-02-14 NOTE — Assessment & Plan Note (Signed)
No broken nose on exam and nares clear. Advised this will heal normally without intervention.

## 2018-02-15 ENCOUNTER — Telehealth: Payer: Self-pay | Admitting: Internal Medicine

## 2018-02-15 NOTE — Telephone Encounter (Signed)
Patient has dropped off FMLA forms to be completed. He is requesting to return to work on 02/20/18. Forms have been completed & placed in providers box to review and sign.

## 2018-02-16 DIAGNOSIS — Z0279 Encounter for issue of other medical certificate: Secondary | ICD-10-CM

## 2018-02-16 NOTE — Telephone Encounter (Signed)
Forms have been signed, [facility] @ [phone], Copy sent to scan &charged for.   Patient informed & Original mailed.

## 2018-07-26 ENCOUNTER — Ambulatory Visit (INDEPENDENT_AMBULATORY_CARE_PROVIDER_SITE_OTHER): Payer: BLUE CROSS/BLUE SHIELD | Admitting: Internal Medicine

## 2018-07-26 ENCOUNTER — Encounter: Payer: Self-pay | Admitting: Internal Medicine

## 2018-07-26 DIAGNOSIS — Z20828 Contact with and (suspected) exposure to other viral communicable diseases: Secondary | ICD-10-CM | POA: Diagnosis not present

## 2018-07-26 DIAGNOSIS — Z20822 Contact with and (suspected) exposure to covid-19: Secondary | ICD-10-CM | POA: Insufficient documentation

## 2018-07-26 NOTE — Assessment & Plan Note (Signed)
Potential exposure and recommend home from work 14 days and talk with his work about conditions upon return and practice social distancing.

## 2018-07-26 NOTE — Progress Notes (Signed)
Virtual Visit via Video Note  I connected with Howard Hamilton on 07/26/18 at  8:00 AM EDT by a video enabled telemedicine application and verified that I am speaking with the correct person using two identifiers.  The patient and the provider were at separate locations throughout the entire encounter.   I discussed the limitations of evaluation and management by telemedicine and the availability of in person appointments. The patient expressed understanding and agreed to proceed.  History of Present Illness: The patient is a 33 y.o. man with visit for potential exposure to covid-19. He works at [employer] and supposedly people there have been out with covid-19. They are not informing employees about this at his job. There is not adequate social distancing and they only were given masks about 2 weeks ago. He does not know if he was exposed and does not feel it is a safe work environment. Has no symptoms of covid-19 currently. Denies fevers or chills or cough or SOB.  Observations/Objective: Appearance: normal, breathing appears normal, casual grooming, abdomen does not appear distended, throat normal, memory normal, mental status is A and O times 3  Assessment and Plan: See problem oriented charting  Follow Up Instructions: Note given for potential exposure and need to quarantine for 14 days  I discussed the assessment and treatment plan with the patient. The patient was provided an opportunity to ask questions and all were answered. The patient agreed with the plan and demonstrated an understanding of the instructions.   The patient was advised to call back or seek an in-person evaluation if the symptoms worsen or if the condition fails to improve as anticipated.  Myrlene Broker, MD

## 2018-08-07 ENCOUNTER — Telehealth: Payer: Self-pay

## 2018-08-07 NOTE — Telephone Encounter (Signed)
He should be able to use same note for return to work as it has parameters for return to work.

## 2018-08-07 NOTE — Telephone Encounter (Signed)
Copied from [place] [id number]. Topic: Quick Communication - See Telephone Encounter >> Aug 07, 2018 11:09 AM Loma Boston wrote: CRM for notification. See Telephone encounter for: 08/07/18. PT states that Dr C had put him in quarantine for begin exposed to covid at work. He is having and had no symptoms and has been  out for 14 plus days and needs a note to return to work. Please e-mail a note to [email or web address]

## 2018-08-07 NOTE — Telephone Encounter (Signed)
LVM informing patient of MD response  

## 2018-12-26 ENCOUNTER — Encounter: Payer: Self-pay | Admitting: Internal Medicine

## 2018-12-26 ENCOUNTER — Ambulatory Visit (INDEPENDENT_AMBULATORY_CARE_PROVIDER_SITE_OTHER): Payer: BLUE CROSS/BLUE SHIELD | Admitting: Internal Medicine

## 2018-12-26 ENCOUNTER — Other Ambulatory Visit: Payer: Self-pay

## 2018-12-26 DIAGNOSIS — Z20822 Contact with and (suspected) exposure to covid-19: Secondary | ICD-10-CM

## 2018-12-26 DIAGNOSIS — Z20828 Contact with and (suspected) exposure to other viral communicable diseases: Secondary | ICD-10-CM

## 2018-12-26 NOTE — Progress Notes (Signed)
Virtual Visit via Video Note  I connected with Howard Hamilton on 12/26/18 at 10:20 AM EDT by a video enabled telemedicine application and verified that I am speaking with the correct person using two identifiers.  The patient and the provider were at separate locations throughout the entire encounter.   I discussed the limitations of evaluation and management by telemedicine and the availability of in person appointments. The patient expressed understanding and agreed to proceed. The patient and the provider were the only parties present for the visit unless noted in HPI below.  History of Present Illness: The patient is a 33 y.o. man with visit for exposure to covid-19. Started unknown time. He has had multiple people at work which have been diagnosed with covid-19 and his work does not inform in a timely fashion. He is supposed to wear mask at work and does at all times but is concerned due to others not wearing masks and this not being enforced. Has no symptoms of covid-19. Denies SOB or cough or fevers. Has tried nothing.  Observations/Objective: Appearance: normal, breathing appears normal, casual grooming, abdomen does not appear distended, throat normal, memory normal, mental status is A and O times 3  Assessment and Plan: See problem oriented charting  Follow Up Instructions: covid-19 testing ordered  I discussed the assessment and treatment plan with the patient. The patient was provided an opportunity to ask questions and all were answered. The patient agreed with the plan and demonstrated an understanding of the instructions.   The patient was advised to call back or seek an in-person evaluation if the symptoms worsen or if the condition fails to improve as anticipated.  Hoyt Koch, MD

## 2018-12-26 NOTE — Assessment & Plan Note (Signed)
Covid-19 testing ordered. He is advised to talk to work supervisor about mask compliance and continue wearing his mask all the time as he has been doing.

## 2018-12-28 LAB — NOVEL CORONAVIRUS, NAA: SARS-CoV-2, NAA: NOT DETECTED

## 2020-03-21 ENCOUNTER — Ambulatory Visit: Payer: Self-pay

## 2020-03-21 ENCOUNTER — Ambulatory Visit
Admission: EM | Admit: 2020-03-21 | Discharge: 2020-03-21 | Disposition: A | Discharge status: Home / Self Care | Payer: Self-pay | Attending: Emergency Medicine | Admitting: Emergency Medicine

## 2020-03-21 ENCOUNTER — Other Ambulatory Visit: Payer: Self-pay

## 2020-03-21 DIAGNOSIS — M940 Chondrocostal junction syndrome [Tietze]: Secondary | ICD-10-CM

## 2020-03-21 MED ORDER — NAPROXEN 500 MG PO TABS: 500.0000 mg | ORAL_TABLET | Freq: Two times a day (BID) | ORAL | 0 refills | Status: DC

## 2020-03-21 NOTE — Discharge Instructions (Signed)
Naprosyn twice daily with food Alternate ice and heat Follow-up if not improving or worsening

## 2020-03-21 NOTE — ED Provider Notes (Signed)
[facility]    CSN: [id number] Arrival date & time: 03/21/20  0956      History   Chief Complaint Chief Complaint  Patient presents with  . Muscle Pain  . chest discomfort    HPI Howard Hamilton is a 35 y.o. male history of tobacco use presenting today for evaluation of chest pain.  Reports that he has had chest pain in his left central chest for approximately 1 week.  Pain mainly with looking downward with neck and certain movements of shoulders.  He denies any exertional chest pain.  Denies any shortness of breath difficulty breathing.  Denies recent cough.  Patient does admit to vaping and marijuana use.  Denies cocaine.  Denies hypertension, diabetes.  Denies family history of death from early age from MI.  Denies any leg pain or leg swelling.  Using Tylenol/ibuprofen intermittently without relief.  Denies abdominal pain nausea or vomiting.  HPI  History reviewed. No pertinent past medical history.  Patient Active Problem List   Diagnosis Date Noted  . Exposure to COVID-19 virus 07/26/2018  . Low back pain 02/14/2018  . Neck pain 02/14/2018  . Nausea 02/14/2018  . Nose pain 02/14/2018  . Routine general medical examination at a health care facility 05/16/2015  . Tobacco abuse 05/16/2015    Past Surgical History:  Procedure Laterality Date  . HERNIA REPAIR         Home Medications    Prior to Admission medications   Medication Sig Start Date End Date Taking? Authorizing Provider  naproxen (NAPROSYN) 500 MG tablet Take 1 tablet (500 mg total) by mouth 2 (two) times daily. 03/21/20  Yes Dehaven Sine, Junius Creamer, PA-C    Family History Family History  Problem Relation Age of Onset  . Cancer Mother        colon  . Cancer Maternal Grandmother   . Diabetes Father   . Asthma Father   . Cancer Father     Social History Social History   Tobacco Use  . Smoking status: Current Every Day Smoker    Packs/day: 0.50    Types: Cigarettes  . Smokeless  tobacco: Never Used  Substance Use Topics  . Alcohol use: Yes  . Drug use: Yes    Types: Marijuana     Allergies   Patient has no known allergies.   Review of Systems Review of Systems  Constitutional: Negative for fatigue and fever.  HENT: Negative for congestion, sinus pressure and sore throat.   Eyes: Negative for photophobia, pain and visual disturbance.  Respiratory: Negative for cough and shortness of breath.   Cardiovascular: Positive for chest pain.  Gastrointestinal: Negative for abdominal pain, nausea and vomiting.  Genitourinary: Negative for decreased urine volume and hematuria.  Musculoskeletal: Positive for myalgias. Negative for neck pain and neck stiffness.  Neurological: Negative for dizziness, syncope, facial asymmetry, speech difficulty, weakness, light-headedness, numbness and headaches.     Physical Exam Triage Vital Signs ED Triage Vitals  Enc Vitals Group     BP      Pulse      Resp      Temp      Temp src      SpO2      Weight      Height      Head Circumference      Peak Flow      Pain Score      Pain Loc      Pain Edu?  Excl. in GC?    No data found.  Updated Vital Signs BP 116/80 (BP Location: Left Arm)   Pulse (!) 58   Temp 99.2 F (37.3 C) (Oral)   Resp 17   SpO2 98%   Visual Acuity Right Eye Distance:   Left Eye Distance:   Bilateral Distance:    Right Eye Near:   Left Eye Near:    Bilateral Near:     Physical Exam Vitals and nursing note reviewed.  Constitutional:      Appearance: He is well-developed and well-nourished.     Comments: No acute distress  HENT:     Head: Normocephalic and atraumatic.     Nose: Nose normal.  Eyes:     Conjunctiva/sclera: Conjunctivae normal.  Cardiovascular:     Rate and Rhythm: Normal rate and regular rhythm.  Pulmonary:     Effort: Pulmonary effort is normal. No respiratory distress.     Comments: Breathing comfortably at rest, CTABL, no wheezing, rales or other  adventitious sounds auscultated Abdominal:     General: There is no distension.  Musculoskeletal:        General: Normal range of motion.     Cervical back: Neck supple.     Comments: Mild reproducible tenderness to palpation along the left sternal border  Bilateral lower legs symmetric without calf tenderness  Skin:    General: Skin is warm and dry.  Neurological:     Mental Status: He is alert and oriented to person, place, and time.  Psychiatric:        Mood and Affect: Mood and affect normal.      UC Treatments / Results  Labs (all labs ordered are listed, but only abnormal results are displayed) Labs Reviewed - No data to display  EKG   Radiology No results found.  Procedures Procedures (including critical care time)  Medications Ordered in UC Medications - No data to display  Initial Impression / Assessment and Plan / UC Course  I have reviewed the triage vital signs and the nursing notes.  Pertinent labs & imaging results that were available during my care of the patient were reviewed by me and considered in my medical decision making (see chart for details).     Suspect chest discomfort MSK in nature, costochondritis.  Recommending continued use of anti-inflammatories and monitoring over the next week.  Low suspicion of ACS.  Lungs clear to auscultation.  Uncorrelated to eating.  Discussed strict return precautions. Patient verbalized understanding and is agreeable with plan.  Final Clinical Impressions(s) / UC Diagnoses   Final diagnoses:  Costochondritis     Discharge Instructions     Naprosyn twice daily with food Alternate ice and heat Follow-up if not improving or worsening    ED Prescriptions    Medication Sig Dispense Auth. Provider   naproxen (NAPROSYN) 500 MG tablet Take 1 tablet (500 mg total) by mouth 2 (two) times daily. 30 tablet Micayla Brathwaite, Velarde C, PA-C     PDMP not reviewed this encounter.   Lew Dawes, [place] 03/21/20  1113

## 2020-03-21 NOTE — ED Triage Notes (Signed)
Pt is here with chest pain only when he looks down that started a week ago, pt has taken Advil/Tylenol to relieve discomfort.

## 2020-04-22 ENCOUNTER — Other Ambulatory Visit: Payer: Self-pay

## 2020-04-22 ENCOUNTER — Encounter (HOSPITAL_COMMUNITY): Payer: Self-pay | Admitting: Emergency Medicine

## 2020-04-22 ENCOUNTER — Emergency Department (HOSPITAL_COMMUNITY)
Admission: EM | Admit: 2020-04-22 | Discharge: 2020-04-22 | Disposition: A | Discharge status: Home / Self Care | Payer: Self-pay | Attending: Emergency Medicine | Admitting: Emergency Medicine

## 2020-04-22 ENCOUNTER — Emergency Department (HOSPITAL_COMMUNITY): Payer: Self-pay

## 2020-04-22 DIAGNOSIS — F17210 Nicotine dependence, cigarettes, uncomplicated: Secondary | ICD-10-CM | POA: Insufficient documentation

## 2020-04-22 DIAGNOSIS — R0789 Other chest pain: Secondary | ICD-10-CM | POA: Insufficient documentation

## 2020-04-22 DIAGNOSIS — Z8616 Personal history of COVID-19: Secondary | ICD-10-CM | POA: Insufficient documentation

## 2020-04-22 LAB — BASIC METABOLIC PANEL
Anion gap: 9 (ref 5–15)
BUN: 20 mg/dL (ref 6–20)
CO2: 28 mmol/L (ref 22–32)
Calcium: 9.3 mg/dL (ref 8.9–10.3)
Chloride: 103 mmol/L (ref 98–111)
Creatinine, Ser: 1.06 mg/dL (ref 0.61–1.24)
GFR, Estimated: >60 mL/min (ref >60)
Glucose, Bld: 97 mg/dL (ref 70–99)
Potassium: 3.8 mmol/L (ref 3.5–5.1)
Sodium: 140 mmol/L (ref 135–145)

## 2020-04-22 LAB — CBC
HCT: 45 % (ref 39.0–52.0)
Hemoglobin: 14.5 g/dL (ref 13.0–17.0)
MCH: 28 pg (ref 26.0–34.0)
MCHC: 32.2 g/dL (ref 30.0–36.0)
MCV: 87 fL (ref 80.0–100.0)
Platelets: 297 10*3/uL (ref 150–400)
RBC: 5.17 MIL/uL (ref 4.22–5.81)
RDW: 13.5 % (ref 11.5–15.5)
WBC: 5.4 10*3/uL (ref 4.0–10.5)
nRBC: 0 % (ref 0.0–0.2)

## 2020-04-22 LAB — TROPONIN I (HIGH SENSITIVITY)
Troponin I (High Sensitivity): 3 ng/L (ref <18)
Troponin I (High Sensitivity): <2 ng/L (ref <18)

## 2020-04-22 NOTE — ED Provider Notes (Signed)
[facility] EMERGENCY DEPARTMENT Provider Note   CSN: [id number] Arrival date & time: 04/22/20  3532     History Chief Complaint  Patient presents with  . Chest Pain    Howard Hamilton is a 35 y.o. male.  35 yo M with a chief complaint of left-sided chest pain.  This is worse with certain positions and movement of his neck.  He denies trauma to the area.  Was seen at urgent care at the onset and was told it was likely musculoskeletal.  He has been taking an anti-inflammatory medicine without significant improvement.  Has tried to stop his normal exercise as per their direction.  He denies any exertional symptoms denies any significant shortness of breath.  Denies trauma.  Denies history of MI.  Denies hemoptysis unilateral lower extremity edema recent surgery immobilization or hospitalization denies history of cancer denies testosterone use.  The history is provided by the patient.  Chest Pain Pain location:  L lateral chest Pain quality: aching   Pain radiates to:  Does not radiate Pain severity:  Moderate Onset quality:  Gradual Duration:  1 month Timing:  Constant Progression:  Unchanged Chronicity:  New Relieved by:  Nothing Worsened by:  Certain positions and movement Ineffective treatments:  None tried Associated symptoms: no abdominal pain, no fever, no headache, no palpitations, no shortness of breath and no vomiting        History reviewed. No pertinent past medical history.  Patient Active Problem List   Diagnosis Date Noted  . Exposure to COVID-19 virus 07/26/2018  . Low back pain 02/14/2018  . Neck pain 02/14/2018  . Nausea 02/14/2018  . Nose pain 02/14/2018  . Routine general medical examination at a health care facility 05/16/2015  . Tobacco abuse 05/16/2015    Past Surgical History:  Procedure Laterality Date  . HERNIA REPAIR         Family History  Problem Relation Age of Onset  . Cancer Mother        colon  . Cancer  Maternal Grandmother   . Diabetes Father   . Asthma Father   . Cancer Father     Social History   Tobacco Use  . Smoking status: Current Every Day Smoker    Packs/day: 0.50    Types: Cigarettes  . Smokeless tobacco: Never Used  Substance Use Topics  . Alcohol use: Yes  . Drug use: Yes    Types: Marijuana    Home Medications Prior to Admission medications   Medication Sig Start Date End Date Taking? Authorizing Provider  naproxen (NAPROSYN) 500 MG tablet Take 1 tablet (500 mg total) by mouth 2 (two) times daily. 03/21/20   Wieters, Hallie C, PA-C    Allergies    Patient has no known allergies.  Review of Systems   Review of Systems  Constitutional: Negative for chills and fever.  HENT: Negative for congestion and facial swelling.   Eyes: Negative for discharge and visual disturbance.  Respiratory: Negative for shortness of breath.   Cardiovascular: Positive for chest pain. Negative for palpitations.  Gastrointestinal: Negative for abdominal pain, diarrhea and vomiting.  Musculoskeletal: Negative for arthralgias and myalgias.  Skin: Negative for color change and rash.  Neurological: Negative for tremors, syncope and headaches.  Psychiatric/Behavioral: Negative for confusion and dysphoric mood.    Physical Exam Updated Vital Signs BP 138/83   Pulse 66   Temp 99 F (37.2 C) (Oral)   Resp 16   SpO2 100%  Physical Exam Vitals and nursing note reviewed.  Constitutional:      Appearance: He is well-developed and well-nourished.  HENT:     Head: Normocephalic and atraumatic.  Eyes:     Extraocular Movements: EOM normal.     Pupils: Pupils are equal, round, and reactive to light.  Neck:     Vascular: No JVD.  Cardiovascular:     Rate and Rhythm: Normal rate and regular rhythm.     Heart sounds: No murmur heard. No friction rub. No gallop.   Pulmonary:     Effort: No respiratory distress.     Breath sounds: No wheezing.  Chest:     Chest wall: No tenderness.      Comments: No obvious tenderness with palpation of the left anterior chest though pain is reproduced with forward flexion of the neck. Abdominal:     General: There is no distension.     Tenderness: There is no guarding or rebound.  Musculoskeletal:        General: Normal range of motion.     Cervical back: Normal range of motion and neck supple.  Skin:    Coloration: Skin is not pale.     Findings: No rash.  Neurological:     Mental Status: He is alert and oriented to person, place, and time.  Psychiatric:        Mood and Affect: Mood and affect normal.        Behavior: Behavior normal.     ED Results / Procedures / Treatments   Labs (all labs ordered are listed, but only abnormal results are displayed) Labs Reviewed  BASIC METABOLIC PANEL  CBC  TROPONIN I (HIGH SENSITIVITY)  TROPONIN I (HIGH SENSITIVITY)    EKG EKG Interpretation  Date/Time:  Tuesday April 22 2020 12:29:37 EST Ventricular Rate:  64 PR Interval:  126 QRS Duration: 100 QT Interval:  378 QTC Calculation: 389 R Axis:   75 Text Interpretation: Normal sinus rhythm with sinus arrhythmia Cannot rule out Anterior infarct , age undetermined Abnormal ECG No old tracing to compare Confirmed by Melene Plan [phone]) on 04/22/2020 12:37:03 PM   Radiology DG Chest 2 View  Result Date: 04/22/2020 CLINICAL DATA:  36 year old male with left chest pain for 1 week. EXAM: CHEST - 2 VIEW COMPARISON:  None. FINDINGS: EKG button artifact in the upper lungs. Lung volumes and mediastinal contours appear normal. Both lungs appear clear. No pneumothorax or pleural effusion. Visualized tracheal air column is within normal limits. Negative visible bowel gas, osseous structures. IMPRESSION: Negative.  No cardiopulmonary abnormality. Electronically Signed   By: Odessa Fleming M.D.   On: 04/22/2020 09:25    Procedures Procedures   Medications Ordered in ED Medications - No data to display  ED Course  I have reviewed the triage  vital signs and the nursing notes.  Pertinent labs & imaging results that were available during my care of the patient were reviewed by me and considered in my medical decision making (see chart for details).    MDM Rules/Calculators/A&P                          35 yo M with a chief complaints of left-sided chest pain.  Seems musculoskeletal by history.  Chest x-ray viewed by me without focal infiltrate or pneumothorax.  Having difficulty seeing his EKG electronically.  We will have them repeated.  Initial troponin is negative.  Delta negative.  EKG without signs  of ischemia.  Will discharge patient home.  PCP follow-up  12:39 PM:  I have discussed the diagnosis/risks/treatment options with the patient and believe the pt to be eligible for discharge home to follow-up with PCP. We also discussed returning to the ED immediately if new or worsening sx occur. We discussed the sx which are most concerning (e.g., sudden worsening pain, fever, inability to tolerate by mouth, exertional s/sx) that necessitate immediate return. Medications administered to the patient during their visit and any new prescriptions provided to the patient are listed below.  Medications given during this visit Medications - No data to display   The patient appears reasonably screen and/or stabilized for discharge and I doubt any other medical condition or other [facility] requiring further screening, evaluation, or treatment in the ED at this time prior to discharge.   Final Clinical Impression(s) / ED Diagnoses Final diagnoses:  Chest wall pain    Rx / DC Orders ED Discharge Orders    None       Melene Plan, DO 04/22/20 1239

## 2020-04-22 NOTE — ED Notes (Signed)
Patient verbalizes understanding of discharge instructions. Opportunity for questioning and answers were provided. Armband removed by staff, pt discharged from ED.  

## 2020-04-22 NOTE — ED Triage Notes (Signed)
Pt reports pain to center of chest and L chest x 1 month.  States pain worse with movement.  No pain at present.  Denies SOB, nausea, and vomiting.  States he was seen at [facility] approx 2-3 weeks ago and was told that it would take awhile to get better but he is concerned he hasn't had any improvement.

## 2020-04-22 NOTE — Discharge Instructions (Signed)
Take 4 over the counter ibuprofen tablets 3 times a day or 2 over-the-counter naproxen tablets twice a day for pain. Also take tylenol 1000mg (2 extra strength) four times a day.    Try to identify what activities you do that bother this part of your chest.  It could be anything that you do throughout the day.  Follow-up with your family doctor.  This is a prolonged issue they may need to do physical therapy.

## 2021-08-24 ENCOUNTER — Ambulatory Visit
Admission: EM | Admit: 2021-08-24 | Discharge: 2021-08-24 | Disposition: A | Discharge status: Home / Self Care | Payer: Self-pay | Attending: Internal Medicine | Admitting: Internal Medicine

## 2021-08-24 DIAGNOSIS — S39012A Strain of muscle, fascia and tendon of lower back, initial encounter: Secondary | ICD-10-CM

## 2021-08-24 DIAGNOSIS — M545 Low back pain, unspecified: Secondary | ICD-10-CM

## 2021-08-24 MED ORDER — CYCLOBENZAPRINE HCL 5 MG PO TABS: 5.0000 mg | ORAL_TABLET | Freq: Two times a day (BID) | ORAL | 0 refills | Status: DC | PRN

## 2021-08-24 MED ORDER — PREDNISONE 20 MG PO TABS: 40.0000 mg | ORAL_TABLET | Freq: Every day | ORAL | 0 refills | Status: AC

## 2021-08-24 NOTE — ED Provider Notes (Signed)
[facility]    CSN: [id number] Arrival date & time: 08/24/21  1748      History   Chief Complaint Chief Complaint  Patient presents with   Back Pain    HPI Howard Hamilton is a 36 y.o. male.   Patient presents with left lower back pain that has been present intermittently over the past month.  He states that it started in his upper back after he was playing basketball with his son.  It has moved down to left lower back.  He has taken ibuprofen and Tylenol with minimal improvement.  Denies any numbness or tingling and pain does not radiate.  Denies urinary frequency, urinary or bowel continence, saddle anesthesia.   Back Pain   History reviewed. No pertinent past medical history.  Patient Active Problem List   Diagnosis Date Noted   Exposure to COVID-19 virus 07/26/2018   Low back pain 02/14/2018   Neck pain 02/14/2018   Nausea 02/14/2018   Nose pain 02/14/2018   Routine general medical examination at a health care facility 05/16/2015   Tobacco abuse 05/16/2015    Past Surgical History:  Procedure Laterality Date   HERNIA REPAIR         Home Medications    Prior to Admission medications   Medication Sig Start Date End Date Taking? Authorizing Provider  cyclobenzaprine (FLEXERIL) 5 MG tablet Take 1 tablet (5 mg total) by mouth 2 (two) times daily as needed for muscle spasms. 08/24/21  Yes Laree Garron, Rolly Salter E, FNP  predniSONE (DELTASONE) 20 MG tablet Take 2 tablets (40 mg total) by mouth daily for 5 days. 08/24/21 08/29/21 Yes Malkia Nippert, Acie Fredrickson, FNP  naproxen (NAPROSYN) 500 MG tablet Take 1 tablet (500 mg total) by mouth 2 (two) times daily. 03/21/20   Wieters, Junius Creamer, PA-C    Family History Family History  Problem Relation Age of Onset   Cancer Mother        colon   Cancer Maternal Grandmother    Diabetes Father    Asthma Father    Cancer Father     Social History Social History   Tobacco Use   Smoking status: Never   Smokeless tobacco: Never   Substance Use Topics   Alcohol use: Yes   Drug use: Yes    Frequency: 7.0 times per week    Types: Marijuana    Comment: daily     Allergies   Patient has no known allergies.   Review of Systems Review of Systems Per HPI  Physical Exam Triage Vital Signs ED Triage Vitals  Enc Vitals Group     BP 08/24/21 1906 114/82     Pulse Rate 08/24/21 1906 69     Resp 08/24/21 1906 15     Temp 08/24/21 1906 99.1 F (37.3 C)     Temp Source 08/24/21 1906 Oral     SpO2 08/24/21 1906 97 %     Weight --      Height --      Head Circumference --      Peak Flow --      Pain Score 08/24/21 1913 10     Pain Loc --      Pain Edu? --      Excl. in GC? --    No data found.  Updated Vital Signs BP 114/82 (BP Location: Left Arm)   Pulse 69   Temp 99.1 F (37.3 C) (Oral)   Resp 15   SpO2 97%  Visual Acuity Right Eye Distance:   Left Eye Distance:   Bilateral Distance:    Right Eye Near:   Left Eye Near:    Bilateral Near:     Physical Exam Constitutional:      General: He is not in acute distress.    Appearance: Normal appearance. He is not toxic-appearing or diaphoretic.  HENT:     Head: Normocephalic and atraumatic.  Eyes:     Extraocular Movements: Extraocular movements intact.     Conjunctiva/sclera: Conjunctivae normal.  Pulmonary:     Effort: Pulmonary effort is normal.  Musculoskeletal:     Cervical back: Normal.     Thoracic back: Normal.     Lumbar back: Tenderness present. No swelling or bony tenderness. Negative right straight leg raise test and negative left straight leg raise test.       Back:     Comments: Tenderness to palpation to left lower lumbar region.  No direct spinal tenderness, crepitus, step-off.  Neurological:     General: No focal deficit present.     Mental Status: He is alert and oriented to person, place, and time. Mental status is at baseline.     Deep Tendon Reflexes: Reflexes are normal and symmetric.  Psychiatric:        Mood  and Affect: Mood normal.        Behavior: Behavior normal.        Thought Content: Thought content normal.        Judgment: Judgment normal.      UC Treatments / Results  Labs (all labs ordered are listed, but only abnormal results are displayed) Labs Reviewed - No data to display  EKG   Radiology No results found.  Procedures Procedures (including critical care time)  Medications Ordered in UC Medications - No data to display  Initial Impression / Assessment and Plan / UC Course  I have reviewed the triage vital signs and the nursing notes.  Pertinent labs & imaging results that were available during my care of the patient were reviewed by me and considered in my medical decision making (see chart for details).     Suspect lumbar strain.  Will treat with muscle relaxers and prednisone given that pain has been refractory to NSAIDs and Tylenol.  Also advised alternating ice and heat to affected area.  Advised patient that muscle relaxer can cause drowsiness.  Do not think that imaging is necessary given no direct spinal tenderness noted.  Patient was given strict return and ER precautions.  Patient verbalized understanding and was agreeable with plan. Final Clinical Impressions(s) / UC Diagnoses   Final diagnoses:  Acute left-sided low back pain without sciatica  Strain of lumbar region, initial encounter     Discharge Instructions      It appears that you have strained your back.  You have been prescribed 2 medications to help alleviate the symptoms.  Please be advised that muscle relaxer can cause drowsiness so do not drive while taking this medication.  Also alternate ice and heat to affected area.  Follow-up if symptoms persist or worsen.    ED Prescriptions     Medication Sig Dispense Auth. Provider   predniSONE (DELTASONE) 20 MG tablet Take 2 tablets (40 mg total) by mouth daily for 5 days. 10 tablet [place], Atwood E, [place]   cyclobenzaprine (FLEXERIL) 5 MG tablet  Take 1 tablet (5 mg total) by mouth 2 (two) times daily as needed for muscle spasms. 20 tablet [place],  Acie Fredrickson, FNP      PDMP not reviewed this encounter.   Gustavus Bryant, [place] 08/24/21 [phone]

## 2021-08-24 NOTE — ED Triage Notes (Signed)
 Patient presents to Urgent Care with complaints of back pain since earlier this month when he was playing ball with his son. Patient reports motrin and tylenol  for pain. 10/10 pain that is burning in nature. Pt reports last dose of otc medication was 2 days ago. Pt reports no dysuria.

## 2021-12-13 IMAGING — DX DG CHEST 2V
2 series · 2 of 2 positions shown · non-contrast
Comparison: None.

CLINICAL DATA: 34-year-old male with left chest pain for 1 week.

EXAM:
CHEST - 2 VIEW

[w chest pa]
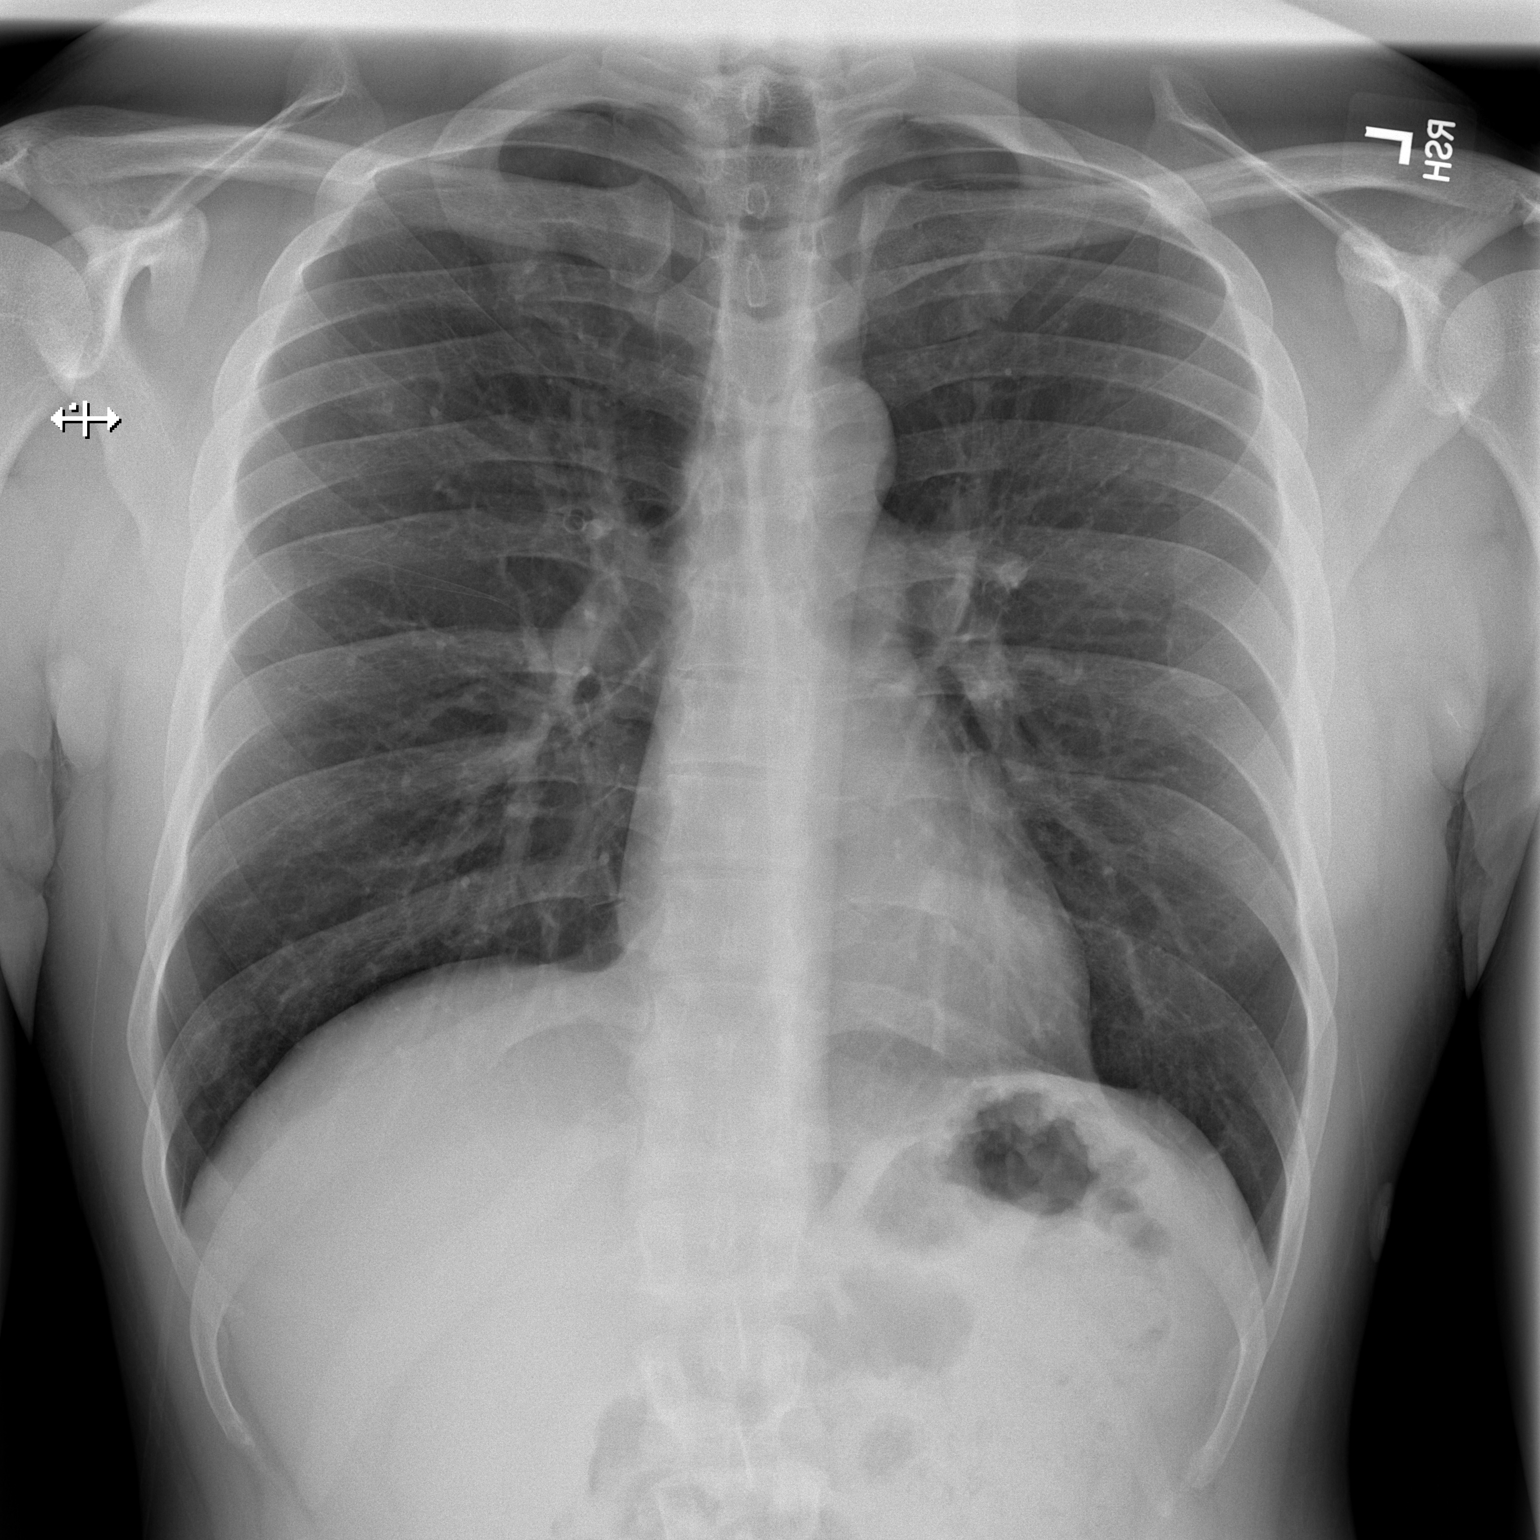

[w chest lat]
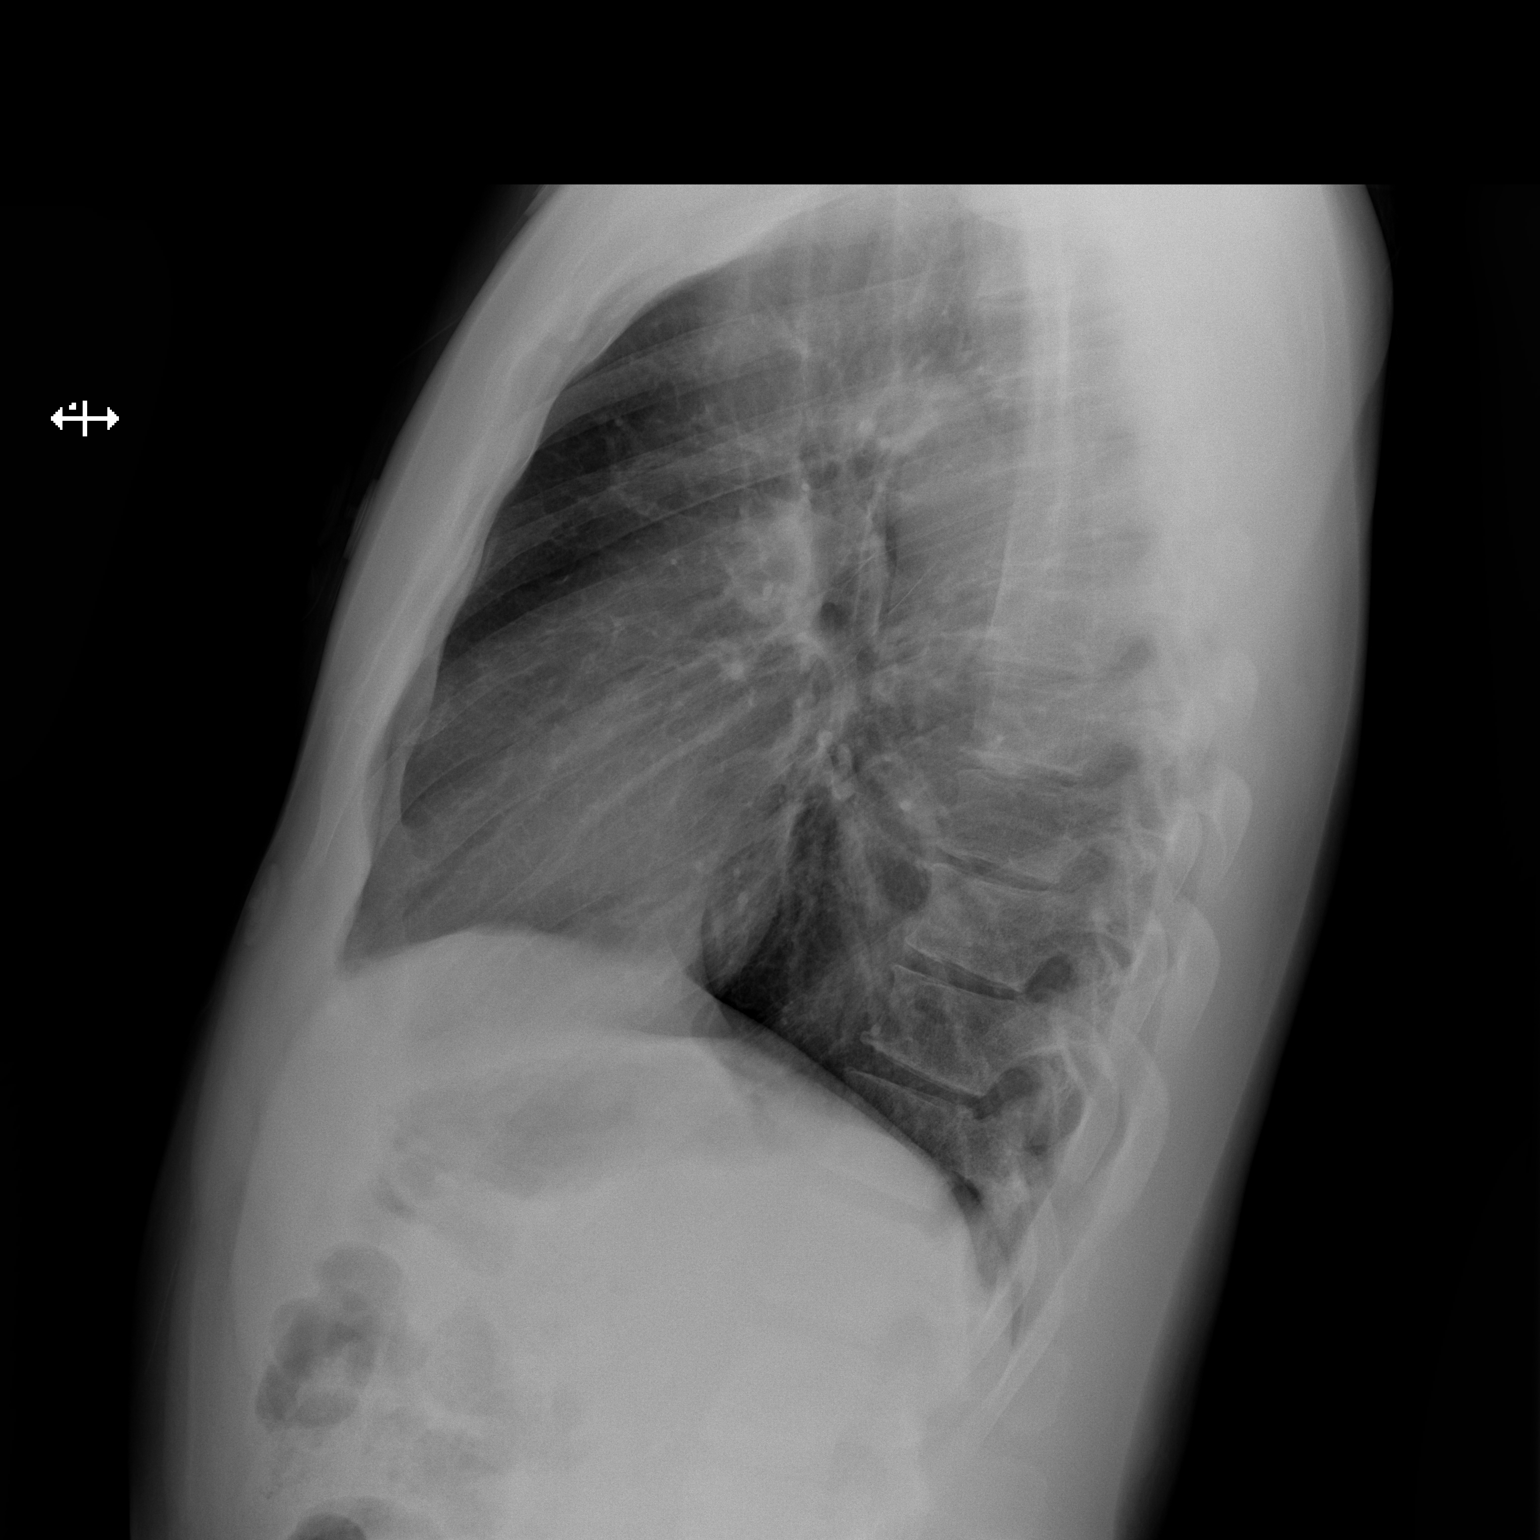

[2 of 2 positions shown; findings below may reference images not displayed]

FINDINGS: EKG button artifact in the upper lungs. Lung volumes and mediastinal
contours appear normal. Both lungs appear clear. No pneumothorax or
pleural effusion. Visualized tracheal air column is within normal
limits. Negative visible bowel gas, osseous structures.
IMPRESSION: Negative.  No cardiopulmonary abnormality.

## 2023-02-13 ENCOUNTER — Telehealth: Payer: Self-pay | Admitting: Emergency Medicine

## 2023-02-13 ENCOUNTER — Ambulatory Visit
Admission: EM | Admit: 2023-02-13 | Discharge: 2023-02-13 | Disposition: A | Discharge status: Home / Self Care | Payer: Self-pay | Attending: Emergency Medicine | Admitting: Emergency Medicine

## 2023-02-13 DIAGNOSIS — J0100 Acute maxillary sinusitis, unspecified: Secondary | ICD-10-CM

## 2023-02-13 DIAGNOSIS — J069 Acute upper respiratory infection, unspecified: Secondary | ICD-10-CM

## 2023-02-13 LAB — POC COVID19/FLU A&B COMBO
Covid Antigen, POC: NEGATIVE
Influenza A Antigen, POC: NEGATIVE
Influenza B Antigen, POC: NEGATIVE

## 2023-02-13 MED ORDER — PREDNISONE 10 MG PO TABS: 20.0000 mg | ORAL_TABLET | Freq: Every day | ORAL | 0 refills | Status: DC

## 2023-02-13 MED ORDER — AMOXICILLIN-POT CLAVULANATE 875-125 MG PO TABS: 1.0000 | ORAL_TABLET | Freq: Two times a day (BID) | ORAL | 0 refills | Status: DC

## 2023-02-13 MED ORDER — BENZONATATE 100 MG PO CAPS: 100.0000 mg | ORAL_CAPSULE | Freq: Three times a day (TID) | ORAL | 0 refills | Status: DC

## 2023-02-13 MED ORDER — FLUTICASONE PROPIONATE 50 MCG/ACT NA SUSP: 1.0000 | Freq: Every day | NASAL | 0 refills | Status: AC

## 2023-02-13 MED ORDER — PREDNISONE 10 MG PO TABS: 20.0000 mg | ORAL_TABLET | Freq: Every day | ORAL | 0 refills | Status: AC

## 2023-02-13 MED ORDER — FLUTICASONE PROPIONATE 50 MCG/ACT NA SUSP: 1.0000 | Freq: Every day | NASAL | 0 refills | Status: DC

## 2023-02-13 NOTE — Discharge Instructions (Signed)
Get plenty of rest and push fluids COVID, flu test are negative Tessalon Perles prescribed for cough Prescribed Augmentin/take as directed Flonase was prescribed for congestion Prednisone was prescribed/take as directed Use medications daily for symptom relief Use OTC medications like ibuprofen or tylenol as needed fever or pain Call or go to the ED if you have any new or worsening symptoms such as fever, worsening cough, shortness of breath, chest tightness, chest pain, turning blue, changes in mental status, etc..Marland Kitchen

## 2023-02-13 NOTE — ED Triage Notes (Addendum)
Patient presents with headache, mucus, decreased appetite, runny nose, chills and cough x day 4. Treated with Tylenol cold and flu.

## 2023-02-13 NOTE — Telephone Encounter (Signed)
Pt called in stating the pharmacy is closed today and would like prescriptions sent to [facility] [address]

## 2023-02-13 NOTE — ED Provider Notes (Signed)
[facility]   [id number] 02/13/23 Arrival Time: 0856   CC: URI  SUBJECTIVE: History from: patient.  Howard Hamilton is a 37 y.o. male Howard Hamilton to the urgent care for complaint of headache, upper respiratory symptom with congestion and cough for the past 4 days.  Denies sick exposure to COVID, flu or strep.  Denies recent travel.  Has tried OTC medication without relief.  Denies any alleviating or aggravating factors.  Did not 9 previous symptoms in the past.   Denies fever, chills, fatigue, sinus pain, rhinorrhea, sore throat, SOB, wheezing, chest pain, nausea, changes in bowel or bladder habits.      ROS: As per HPI.  All other pertinent ROS negative.     History reviewed. No pertinent past medical history. Past Surgical History:  Procedure Laterality Date   HERNIA REPAIR     No Known Allergies No current facility-administered medications on file prior to encounter.   Current Outpatient Medications on File Prior to Encounter  Medication Sig Dispense Refill   cyclobenzaprine (FLEXERIL) 5 MG tablet Take 1 tablet (5 mg total) by mouth 2 (two) times daily as needed for muscle spasms. 20 tablet 0   naproxen (NAPROSYN) 500 MG tablet Take 1 tablet (500 mg total) by mouth 2 (two) times daily. 30 tablet 0   Social History   Socioeconomic History   Marital status: Single    Spouse name: Not on file   Number of children: Not on file   Years of education: Not on file   Highest education level: Not on file  Occupational History   Not on file  Tobacco Use   Smoking status: Every Day    Types: Cigarettes   Smokeless tobacco: Never  Substance and Sexual Activity   Alcohol use: Yes   Drug use: Not Currently    Frequency: 7.0 times per week    Types: Marijuana    Comment: daily   Sexual activity: Yes    Birth control/protection: None  Other Topics Concern   Not on file  Social History Narrative   Not on file   Social Drivers of Health   Financial Resource Strain:  Not on file  Food Insecurity: Not on file  Transportation Needs: Not on file  Physical Activity: Not on file  Stress: Not on file  Social Connections: Not on file  Intimate Partner Violence: Not on file   Family History  Problem Relation Age of Onset   Cancer Mother        colon   Cancer Maternal Grandmother    Diabetes Father    Asthma Father    Cancer Father     OBJECTIVE:  Vitals:   02/13/23 1007 02/13/23 1010  BP:  126/86  Pulse:  62  Resp:  18  Temp:  98.7 F (37.1 C)  TempSrc:  Oral  SpO2:  100%  Weight: 165 lb (74.8 kg)   Height: 6' (1.829 m)      Physical Exam Vitals and nursing note reviewed.  Constitutional:      General: He is not in acute distress.    Appearance: Normal appearance. He is normal weight. He is not ill-appearing, toxic-appearing or diaphoretic.  HENT:     Nose: Congestion present.     Right Sinus: Maxillary sinus tenderness present.     Left Sinus: Maxillary sinus tenderness present.     Mouth/Throat:     Mouth: Mucous membranes are moist.     Pharynx: No oropharyngeal exudate or posterior  oropharyngeal erythema.     Tonsils: 0 on the right. 0 on the left.  Cardiovascular:     Rate and Rhythm: Normal rate and regular rhythm.     Pulses: Normal pulses.     Heart sounds: Normal heart sounds. No murmur heard.    No friction rub. No gallop.  Pulmonary:     Effort: Pulmonary effort is normal. No respiratory distress.     Breath sounds: Normal breath sounds. No stridor. No wheezing, rhonchi or rales.  Chest:     Chest wall: No tenderness.  Neurological:     Mental Status: He is alert and oriented to person, place, and time.      LABS:  Results for orders placed or performed during the hospital encounter of 02/13/23 (from the past 24 hours)  POC Covid + Flu A/B Antigen     Status: Normal   Collection Time: 02/13/23 10:32 AM  Result Value Ref Range   Influenza A Antigen, POC Negative    Influenza B Antigen, POC Negative    Covid  Antigen, POC Negative      ASSESSMENT & PLAN:  1. URI with cough and congestion   2. Acute non-recurrent maxillary sinusitis     Meds ordered this encounter  Medications   fluticasone (FLONASE) 50 MCG/ACT nasal spray    Sig: Place 1 spray into both nostrils daily for 14 days.    Dispense:  16 g    Refill:  0   amoxicillin-clavulanate (AUGMENTIN) 875-125 MG tablet    Sig: Take 1 tablet by mouth every 12 (twelve) hours.    Dispense:  14 tablet    Refill:  0   benzonatate (TESSALON) 100 MG capsule    Sig: Take 1 capsule (100 mg total) by mouth every 8 (eight) hours.    Dispense:  21 capsule    Refill:  0   predniSONE (DELTASONE) 10 MG tablet    Sig: Take 2 tablets (20 mg total) by mouth daily for 5 days.    Dispense:  10 tablet    Refill:  0    Discharge instructions   Get plenty of rest and push fluids COVID, flu test are negative Tessalon Perles prescribed for cough Prescribed Augmentin/take as directed Flonase was prescribed for congestion Prednisone was prescribed/take as directed Use medications daily for symptom relief Use OTC medications like ibuprofen or tylenol as needed fever or pain Call or go to the ED if you have any new or worsening symptoms such as fever, worsening cough, shortness of breath, chest tightness, chest pain, turning blue, changes in mental status, etc...   Reviewed expectations re: course of current medical issues. Questions answered. Outlined signs and symptoms indicating need for more acute intervention. Patient verbalized understanding. After Visit Summary given.          Durward Parcel, FNP 02/13/23 1053

## 2023-07-27 ENCOUNTER — Ambulatory Visit
Admission: EM | Admit: 2023-07-27 | Discharge: 2023-07-27 | Disposition: A | Discharge status: Home / Self Care | Attending: Physician Assistant | Admitting: Physician Assistant

## 2023-07-27 DIAGNOSIS — M79605 Pain in left leg: Secondary | ICD-10-CM

## 2023-07-27 MED ORDER — PREDNISONE 20 MG PO TABS
40.0000 mg | ORAL_TABLET | Freq: Every day | ORAL | 0 refills | Status: AC
Start: 2023-07-27 — End: 2023-08-01

## 2023-07-27 NOTE — Discharge Instructions (Signed)
  Avoid ibuprofen, naproxen  and other NSAIDs while taking prednisone .

## 2023-07-27 NOTE — ED Triage Notes (Signed)
"  For about 30-45 days I am feeling left leg pain, it originates in/around my left buttocks with radiation down my leg, sudden movements bring the pain". No injury known.

## 2023-07-27 NOTE — ED Provider Notes (Signed)
 [facility]    CSN: [id number] Arrival date & time: 07/27/23  4196      History   Chief Complaint Chief Complaint  Patient presents with   Leg Problem    HPI Howard Hamilton is a 38 y.o. male.   Presents today for evaluation of continued pain to his left posterior leg that radiates from his buttocks down to his knee.  He reports the pain is a stabbing shooting pain.  He denies any known injury but does do heavy lifting at work.  He has not had any numbness or tingling.  He denies any loss of bowel or bladder function. He denies any weakness  The history is provided by the patient.    History reviewed. No pertinent past medical history.  Patient Active Problem List   Diagnosis Date Noted   Exposure to COVID-19 virus 07/26/2018   Low back pain 02/14/2018   Neck pain 02/14/2018   Nausea 02/14/2018   Nose pain 02/14/2018   Routine general medical examination at a health care facility 05/16/2015   Tobacco abuse 05/16/2015    Past Surgical History:  Procedure Laterality Date   HERNIA REPAIR         Home Medications    Prior to Admission medications   Medication Sig Start Date End Date Taking? Authorizing Provider  naproxen  (NAPROSYN ) 500 MG tablet Take 1 tablet (500 mg total) by mouth 2 (two) times daily. 03/21/20  Yes Wieters, Hallie C, PA-C  predniSONE  (DELTASONE ) 20 MG tablet Take 2 tablets (40 mg total) by mouth daily with breakfast for 5 days. 07/27/23 08/01/23 Yes Vernestine Gondola, PA-C  fluticasone  (FLONASE ) 50 MCG/ACT nasal spray Place 1 spray into both nostrils daily for 14 days. 02/13/23 02/27/23  Avegno, Komlanvi S, FNP    Family History Family History  Problem Relation Age of Onset   Cancer Mother        colon   Cancer Maternal Grandmother    Diabetes Father    Asthma Father    Cancer Father     Social History Social History   Tobacco Use   Smoking status: Some Days    Types: Cigarettes   Smokeless tobacco: Never  Vaping Use    Vaping status: Never Used  Substance Use Topics   Alcohol use: Yes    Comment: Occassionally.   Drug use: Not Currently    Frequency: 7.0 times per week    Types: Marijuana    Comment: daily     Allergies   Patient has no known allergies.   Review of Systems Review of Systems  Constitutional:  Negative for chills and fever.  Eyes:  Negative for discharge and redness.  Respiratory:  Negative for shortness of breath.   Musculoskeletal:  Positive for back pain and myalgias.  Neurological:  Negative for numbness.     Physical Exam Triage Vital Signs ED Triage Vitals  Encounter Vitals Group     BP --      Systolic BP Percentile --      Diastolic BP Percentile --      Pulse --      Resp --      Temp --      Temp src --      SpO2 --      Weight 07/27/23 0854 164 lb 14.5 oz (74.8 kg)     Height 07/27/23 0854 6' (1.829 m)     Head Circumference --      Peak  Flow --      Pain Score 07/27/23 0851 10     Pain Loc --      Pain Education --      Exclude from Growth Chart --    No data found.  Updated Vital Signs BP 107/71 (BP Location: Left Arm)   Pulse 84   Temp 98.1 F (36.7 C) (Oral)   Resp 18   Ht 6' (1.829 m)   Wt 164 lb 14.5 oz (74.8 kg)   SpO2 97%   BMI 22.37 kg/m   Visual Acuity Right Eye Distance:   Left Eye Distance:   Bilateral Distance:    Right Eye Near:   Left Eye Near:    Bilateral Near:     Physical Exam Vitals and nursing note reviewed.  Constitutional:      General: He is not in acute distress.    Appearance: Normal appearance. He is not ill-appearing.  HENT:     Head: Normocephalic and atraumatic.  Eyes:     Conjunctiva/sclera: Conjunctivae normal.  Cardiovascular:     Rate and Rhythm: Normal rate.  Pulmonary:     Effort: Pulmonary effort is normal. No respiratory distress.  Musculoskeletal:     Comments: No TTP to midline thoracic or lumbar spine, normal gait  Neurological:     Mental Status: He is alert.  Psychiatric:         Mood and Affect: Mood normal.        Behavior: Behavior normal.        Thought Content: Thought content normal.      UC Treatments / Results  Labs (all labs ordered are listed, but only abnormal results are displayed) Labs Reviewed - No data to display  EKG   Radiology No results found.  Procedures Procedures (including critical care time)  Medications Ordered in UC Medications - No data to display  Initial Impression / Assessment and Plan / UC Course  I have reviewed the triage vital signs and the nursing notes.  Pertinent labs & imaging results that were available during my care of the patient were reviewed by me and considered in my medical decision making (see chart for details).    Symptoms seem to be consistent with sciatica- will treat with steroid burst and advised follow up if no gradual improvement or with any further concerns.   Final Clinical Impressions(s) / UC Diagnoses   Final diagnoses:  Left leg pain     Discharge Instructions       Avoid ibuprofen, naproxen  and other NSAIDs while taking prednisone .      ED Prescriptions     Medication Sig Dispense Auth. Provider   predniSONE  (DELTASONE ) 20 MG tablet Take 2 tablets (40 mg total) by mouth daily with breakfast for 5 days. 10 tablet Vernestine Gondola, PA-C      PDMP not reviewed this encounter.   Vernestine Gondola, PA-C 07/27/23 [phone]

## 2023-07-31 ENCOUNTER — Other Ambulatory Visit: Payer: Self-pay

## 2023-07-31 ENCOUNTER — Emergency Department (HOSPITAL_COMMUNITY)
Admission: EM | Admit: 2023-07-31 | Discharge: 2023-07-31 | Disposition: A | Discharge status: Home / Self Care | Attending: Emergency Medicine | Admitting: Emergency Medicine

## 2023-07-31 ENCOUNTER — Encounter (HOSPITAL_COMMUNITY): Payer: Self-pay | Admitting: *Deleted

## 2023-07-31 DIAGNOSIS — M5432 Sciatica, left side: Secondary | ICD-10-CM | POA: Diagnosis not present

## 2023-07-31 DIAGNOSIS — M5442 Lumbago with sciatica, left side: Secondary | ICD-10-CM | POA: Diagnosis not present

## 2023-07-31 DIAGNOSIS — M549 Dorsalgia, unspecified: Secondary | ICD-10-CM | POA: Diagnosis not present

## 2023-07-31 MED ORDER — KETOROLAC TROMETHAMINE 30 MG/ML IJ SOLN
30.0000 mg | Freq: Once | INTRAMUSCULAR | Status: AC
  Administered 2023-07-31: 30 mg via INTRAMUSCULAR
  Filled 2023-07-31: qty 1

## 2023-07-31 MED ORDER — NAPROXEN 500 MG PO TABS: 500.0000 mg | ORAL_TABLET | Freq: Two times a day (BID) | ORAL | 0 refills | Status: AC

## 2023-07-31 NOTE — ED Triage Notes (Signed)
 Pt reports pain in the back of his left thigh that started about 2 months ago. When he lays down, the pain goes to his buttocks. When he sits down the pain goes to his ankle. He was seen at [facility], given steroid pack without relief. No specific injury that he knows about.

## 2023-07-31 NOTE — Discharge Instructions (Addendum)
 You are seen in the emergency department today for concerns of leg pain.  Based on your physical exam, your symptoms are highly consistent with sciatic pain.  Given that you have had symptoms ongoing for about 8 weeks, you likely will benefit from follow-up with a neurosurgeon.  You can continue to try to use over-the-counter anti-inflammatory medications for pain control.  Try to gently stretch and walk on the affected leg.  If any worsening symptoms develop such as loss of bowel or bladder control, groin tingling or numbness, please return to the emergency department.

## 2023-07-31 NOTE — ED Provider Notes (Signed)
 [facility] Provider Note   CSN: [id number] Arrival date & time: 07/31/23  2102     History Chief Complaint  Patient presents with   Leg Pain    [place] DEMETRIS CAPELL is a 38 y.o. male.  Patient presents emergency department today with concerns of back pain.  No significant medical history per his report.  Reports that his back pain has been going on for about 8 weeks or so with radiating symptoms down the left leg.  Reports some discomfort in the posterior left thigh, left calf, and left ankle.  He reports that as the pain goes away just with rest although always return back with any activity.  Was seen urgent care few days ago and given a steroid dose.  He reports some improvement with this.  Has tried over-the-counter medications at home with some improvement but again, reports this is an ongoing issue and not fully resolving.  Denies any bowel or bladder incontinence, unilateral weakness or numbness, or saddle paresthesia.   Leg Pain Associated symptoms: back pain        Home Medications Prior to Admission medications   Medication Sig Start Date End Date Taking? Authorizing Provider  naproxen  (NAPROSYN ) 500 MG tablet Take 1 tablet (500 mg total) by mouth 2 (two) times daily for 15 days. 07/31/23 08/15/23 Yes Amybeth Sieg A, PA-C  fluticasone  (FLONASE ) 50 MCG/ACT nasal spray Place 1 spray into both nostrils daily for 14 days. 02/13/23 02/27/23  Avegno, Komlanvi S, FNP  predniSONE  (DELTASONE ) 20 MG tablet Take 2 tablets (40 mg total) by mouth daily with breakfast for 5 days. 07/27/23 08/01/23  Vernestine Gondola, PA-C      Allergies    Patient has no known allergies.    Review of Systems   Review of Systems  Musculoskeletal:  Positive for back pain.  All other systems reviewed and are negative.   Physical Exam Updated Vital Signs BP 130/87 (BP Location: Right Arm)   Pulse 71   Temp 98.3 F (36.8 C)   Resp 16   SpO2 99%  Physical  Exam Vitals and nursing note reviewed.  Constitutional:      General: He is not in acute distress.    Appearance: He is well-developed.  HENT:     Head: Normocephalic and atraumatic.  Eyes:     Conjunctiva/sclera: Conjunctivae normal.  Cardiovascular:     Rate and Rhythm: Normal rate and regular rhythm.     Heart sounds: No murmur heard. Pulmonary:     Effort: Pulmonary effort is normal. No respiratory distress.     Breath sounds: Normal breath sounds.  Abdominal:     Palpations: Abdomen is soft.     Tenderness: There is no abdominal tenderness.  Musculoskeletal:        General: Tenderness present. No swelling.     Cervical back: Neck supple.     Comments: Positive straight leg raise to the left side.  No appreciable calf tenderness.  No significant leg swelling when comparing the left leg to the right.  Skin:    General: Skin is warm and dry.     Capillary Refill: Capillary refill takes less than 2 seconds.  Neurological:     Mental Status: He is alert.  Psychiatric:        Mood and Affect: Mood normal.     ED Results / Procedures / Treatments   Labs (all labs ordered are listed, but only abnormal results are  displayed) Labs Reviewed - No data to display  EKG None  Radiology No results found.  Procedures Procedures    Medications Ordered in ED Medications  ketorolac (TORADOL) 30 MG/ML injection 30 mg (30 mg Intramuscular Given 07/31/23 2223)    ED Course/ Medical Decision Making/ A&P                                 Medical Decision Making Risk Prescription drug management.   This patient presents to the ED for concern of neck pain.  Differential diagnosis includes sciatica, DVT, neuropathy, ischemic limb   Medicines ordered and prescription drug management:  I ordered medication including Toradol for pain Reevaluation of the patient after these medicines showed that the patient improved I have reviewed the patients home medicines and have made  adjustments as needed   Problem List / ED Course:  Patient presents to the emergency department today with concerns of leg pain.  Reports has been ongoing for about 8 weeks.  Could not recall any specific inciting factors although he does report that he works with somewhat heavy lifting and straining frequently.  He denies any obvious trauma, falls, or other injuries.  He reports the pain initiates at the left lower back/hip with radiation towards the entire posterior aspect of the left leg.  Denies any calf tenderness.  Denies any tingling, numbness, or inability to move the left leg. On exam, patient has notable positive straight leg raise to the left side.  Pain worsens with dorsiflexion of the toes.  Negative Homans' sign.  At this time, doubtful of DVT given whole leg symptoms and no obvious calf tenderness or leg swelling seen.  More concern at this time for sciatica given prolonged course of symptoms.  There are no red flags however at this time such as age, fever, anticoagulant use, immunocompromise status, IV drug use, recent surgery, trauma, or any neurological deficits such as urinary retention, incontinence, or sexual dysfunction.  Doubtful cauda equina. A one-time dose of Toradol 30 mg IM was given here in the emergency department.  Advised patient given prolonged course of symptoms at about 8 weeks at this time, he would benefit from outpatient neurosurgery follow-up.  Provided patient with contact formation for [place] [facility].  Advise return precautions such as any concerns for new or worsening symptoms.  He is otherwise stable at this time for outpatient follow-up and discharged home.  Final Clinical Impression(s) / ED Diagnoses Final diagnoses:  Sciatica of left side    Rx / DC Orders ED Discharge Orders          Ordered    naproxen  (NAPROSYN ) 500 MG tablet  2 times daily        07/31/23 2156              Maleya Leever A, PA-C 07/31/23 2225    Mordecai Applebaum, MD 08/03/23 [phone]

## 2023-08-11 DIAGNOSIS — M5416 Radiculopathy, lumbar region: Secondary | ICD-10-CM | POA: Diagnosis not present

## 2023-08-17 DIAGNOSIS — M5416 Radiculopathy, lumbar region: Secondary | ICD-10-CM | POA: Diagnosis not present

## 2023-08-22 ENCOUNTER — Ambulatory Visit: Payer: Self-pay

## 2023-08-22 NOTE — Telephone Encounter (Signed)
 FYI Only or Action Required?: FYI only for provider.  Patient was last seen in primary care on na. Called Nurse Triage reporting Back Pain. Symptoms began several weeks ago. Interventions attempted: Prescription medications: naproxen . Symptoms are: unchanged.  Triage Disposition: See HCP Within 4 Hours (Or PCP Triage)  Patient/caregiver understands and will follow disposition?: Yes     Copied from CRM [id number]. Topic: Clinical - Red Word Triage >> Aug 22, 2023  9:52 AM Corin V wrote: [facility] that prompted transfer to Nurse Triage: Patient is having ongoing pain in his back and legs from his sciatic nerve pain.   Patient is requesting that Dr. Avelina give him a call back to answer some questions. He has sciatic nerve damage and he is having pain and trouble working. He is wanting to know about additional medication options and help since he has a 1 month wait until he can see a specialist and he doesn't think he can wait that long. Reason for Disposition  [1] SEVERE back pain (e.g., excruciating, unable to do any normal activities) AND [2] not improved 2 hours after pain medicine  Answer Assessment - Initial Assessment Questions 1. ONSET: When did the pain begin?      Three months ago 2. LOCATION: Where does it hurt? (upper, mid or lower back)     Left leg pain with back pain 3. SEVERITY: How bad is the pain?  (e.g., Scale 1-10; mild, moderate, or severe)   - MILD (1-3): Doesn't interfere with normal activities.    - MODERATE (4-7): Interferes with normal activities or awakens from sleep.    - SEVERE (8-10): Excruciating pain, unable to do any normal activities.      severe 4. PATTERN: Is the pain constant? (e.g., yes, no; constant, intermittent)      constant 5. RADIATION: Does the pain shoot into your legs or somewhere else?     Down left leg 6. CAUSE:  What do you think is causing the back pain?      Sciatic  7. BACK OVERUSE:  Any recent lifting of heavy objects,  strenuous work or exercise?     denies 8. MEDICINES: What have you taken so far for the pain? (e.g., nothing, acetaminophen , NSAIDS)     Naproxen   9. NEUROLOGIC SYMPTOMS: Do you have any weakness, numbness, or problems with bowel/bladder control?     no 10. OTHER SYMPTOMS: Do you have any other symptoms? (e.g., fever, abdomen pain, burning with urination, blood in urine)       no 11. PREGNANCY: Is there any chance you are pregnant? When was your last menstrual period?       na  Protocols used: Back Pain-A-AH

## 2023-08-23 ENCOUNTER — Ambulatory Visit: Admitting: Family Medicine

## 2023-08-23 ENCOUNTER — Encounter: Payer: Self-pay | Admitting: Family Medicine

## 2023-08-23 VITALS — BP 122/76 | HR 58 | Temp 98.0°F | Ht 72.0 in | Wt 172.8 lb

## 2023-08-23 DIAGNOSIS — G8929 Other chronic pain: Secondary | ICD-10-CM | POA: Insufficient documentation

## 2023-08-23 DIAGNOSIS — M79605 Pain in left leg: Secondary | ICD-10-CM | POA: Insufficient documentation

## 2023-08-23 DIAGNOSIS — G5792 Unspecified mononeuropathy of left lower limb: Secondary | ICD-10-CM | POA: Insufficient documentation

## 2023-08-23 DIAGNOSIS — M25552 Pain in left hip: Secondary | ICD-10-CM

## 2023-08-23 MED ORDER — GABAPENTIN 300 MG PO CAPS: 300.0000 mg | ORAL_CAPSULE | Freq: Three times a day (TID) | ORAL | 3 refills | Status: AC

## 2023-08-23 MED ORDER — HYDROCODONE-ACETAMINOPHEN 5-325 MG PO TABS: 1.0000 | ORAL_TABLET | Freq: Four times a day (QID) | ORAL | 0 refills | Status: DC | PRN

## 2023-08-23 NOTE — Progress Notes (Signed)
 Acute Office Visit  Subjective:     Patient ID: Howard Hamilton, male    DOB: 1986-01-02, 38 y.o.   MRN: [id number]  No chief complaint on file.   HPI Patient presents for evaluation of left hip, left lateral leg, left lateral ankle, and left foot pain that began approximately 1 year ago. Also, he complains of intermittent numbness/paresthesia to left lateral ankle and left foot. States he rarely has lower back pain. He reports he feels the left lower extremity is also weaker. Denies saddle anesthesia, urinary/bowel incontinence. In the past, he has been prescribed Naproxen  and steroids but states these provide no relief. Currently, he is not taking any medications for the pain. He reports having an MRI of his spine last week and is scheduled to see neurosurgeon on 7/16.   ROS See HPI     Objective:    BP 122/76 (BP Location: Left Arm, Patient Position: Sitting)   Pulse (!) 58   Temp 98 F (36.7 C) (Temporal)   Ht 6' (1.829 m)   Wt 172 lb 12.8 oz (78.4 kg)   SpO2 98%   BMI 23.44 kg/m    Physical Exam Vitals reviewed.  Constitutional:      General: He is not in acute distress.    Appearance: Normal appearance. He is normal weight.  HENT:     Head: Normocephalic and atraumatic.   Cardiovascular:     Rate and Rhythm: Normal rate and regular rhythm.     Pulses: Normal pulses.     Heart sounds: Normal heart sounds.  Pulmonary:     Effort: Pulmonary effort is normal.     Breath sounds: Normal breath sounds.   Musculoskeletal:     Cervical back: Normal range of motion and neck supple. No rigidity.     Comments: Left leg + SLR @ 30 degrees and foot dorsiflexion. Left leg muscle strength 4/5. Right leg - SLR. Right leg muscle strength 5/5.    Skin:    General: Skin is warm and dry.     Capillary Refill: Capillary refill takes less than 2 seconds.   Neurological:     Mental Status: He is alert and oriented to person, place, and time.   Psychiatric:        Mood and  Affect: Mood normal.        Behavior: Behavior normal.     No results found for any visits on 08/23/23.      Assessment & Plan:   Problem List Items Addressed This Visit       Nervous and Auditory   Neuropathy of ankle, left   Relevant Medications   gabapentin (NEURONTIN) 300 MG capsule   HYDROcodone -acetaminophen  (NORCO/VICODIN) 5-325 MG tablet     Other   Chronic left hip pain - Primary   Relevant Medications   gabapentin (NEURONTIN) 300 MG capsule   HYDROcodone -acetaminophen  (NORCO/VICODIN) 5-325 MG tablet   Leg pain, lateral, left   Relevant Medications   gabapentin (NEURONTIN) 300 MG capsule   HYDROcodone -acetaminophen  (NORCO/VICODIN) 5-325 MG tablet   Follow-up with neurosurgery as scheduled. MRI results pending.   Meds ordered this encounter  Medications   gabapentin (NEURONTIN) 300 MG capsule    Sig: Take 1 capsule (300 mg total) by mouth 3 (three) times daily.    Dispense:  90 capsule    Refill:  3   HYDROcodone -acetaminophen  (NORCO/VICODIN) 5-325 MG tablet    Sig: Take 1 tablet by mouth every 6 (six) hours as needed  for up to 5 days for moderate pain (pain score 4-6) or severe pain (pain score 7-10).    Dispense:  20 tablet    Refill:  0    Return in about 4 weeks (around 09/20/2023) for follow up.  Debby CHRISTELLA Borer, RN

## 2023-08-23 NOTE — Progress Notes (Deleted)
 New Patient Office Visit  Subjective    Patient ID: Howard Hamilton, male    DOB: 16-Nov-1985  Age: 38 y.o. MRN: [id number]  CC: No chief complaint on file.   HPI Howard Hamilton presents to establish care today. Up to date on routine vaccines. Up to date on routine screenings.  Receives regular dental and eye care.  Reports eating well, sleeping well, feeling well overall.  Reports compliance with medication regimen.  Denies other concerns today.  Outpatient Encounter Medications as of 08/23/2023  Medication Sig   fluticasone  (FLONASE ) 50 MCG/ACT nasal spray Place 1 spray into both nostrils daily for 14 days.   No facility-administered encounter medications on file as of 08/23/2023.    No past medical history on file.  Past Surgical History:  Procedure Laterality Date   HERNIA REPAIR      Family History  Problem Relation Age of Onset   Cancer Mother        colon   Cancer Maternal Grandmother    Diabetes Father    Asthma Father    Cancer Father     Social History   Socioeconomic History   Marital status: Single    Spouse name: Not on file   Number of children: Not on file   Years of education: Not on file   Highest education level: Not on file  Occupational History   Not on file  Tobacco Use   Smoking status: Some Days    Types: Cigarettes   Smokeless tobacco: Never  Vaping Use   Vaping status: Never Used  Substance and Sexual Activity   Alcohol use: Yes    Comment: Occassionally.   Drug use: Not Currently    Frequency: 7.0 times per week    Types: Marijuana    Comment: daily   Sexual activity: Yes    Birth control/protection: None  Other Topics Concern   Not on file  Social History Narrative   Not on file   Social Drivers of Health   Financial Resource Strain: Not on file  Food Insecurity: Not on file  Transportation Needs: Not on file  Physical Activity: Not on file  Stress: Not on file  Social Connections: Not on file  Intimate  Partner Violence: Not on file    ROS Per HPI      Objective    There were no vitals taken for this visit.  Physical Exam Vitals and nursing note reviewed.  Constitutional:      General: He is not in acute distress.    Appearance: Normal appearance.  HENT:     Head: Normocephalic and atraumatic.     Right Ear: External ear normal.     Left Ear: External ear normal.     Nose: Nose normal.     Mouth/Throat:     Mouth: Mucous membranes are moist.     Pharynx: Oropharynx is clear.   Eyes:     Extraocular Movements: Extraocular movements intact.    Cardiovascular:     Rate and Rhythm: Normal rate and regular rhythm.     Pulses: Normal pulses.     Heart sounds: Normal heart sounds.  Pulmonary:     Effort: Pulmonary effort is normal. No respiratory distress.     Breath sounds: Normal breath sounds. No wheezing, rhonchi or rales.   Musculoskeletal:        General: Normal range of motion.     Cervical back: Normal range of motion.  Right lower leg: No edema.     Left lower leg: No edema.  Lymphadenopathy:     Cervical: No cervical adenopathy.   Skin:    General: Skin is warm and dry.   Neurological:     General: No focal deficit present.     Mental Status: He is alert and oriented to person, place, and time.   Psychiatric:        Mood and Affect: Mood normal.        Behavior: Behavior normal.         Assessment & Plan:   There are no diagnoses linked to this encounter.   No follow-ups on file.   Howard LITTIE Ku, FNP

## 2023-08-23 NOTE — Patient Instructions (Signed)

## 2023-09-15 DIAGNOSIS — M5416 Radiculopathy, lumbar region: Secondary | ICD-10-CM | POA: Diagnosis not present

## 2023-09-26 ENCOUNTER — Telehealth: Payer: Self-pay | Admitting: Internal Medicine

## 2023-09-26 ENCOUNTER — Other Ambulatory Visit: Payer: Self-pay | Admitting: Internal Medicine

## 2023-09-26 DIAGNOSIS — G8929 Other chronic pain: Secondary | ICD-10-CM

## 2023-09-26 DIAGNOSIS — G5792 Unspecified mononeuropathy of left lower limb: Secondary | ICD-10-CM

## 2023-09-26 DIAGNOSIS — M79605 Pain in left leg: Secondary | ICD-10-CM

## 2023-09-26 NOTE — Telephone Encounter (Signed)
**  Patient would like to know if the dose can be increased**   LOV: 08/23/23  Last fill: 08/23/23, 20 tablet 0 refill

## 2023-09-26 NOTE — Telephone Encounter (Signed)
 Copied from CRM [id number]. Topic: Clinical - Medication Refill >> Sep 26, 2023  8:04 AM Suzen RAMAN wrote: Medication: HYDROcodone -acetaminophen  (NORCO/VICODIN) 5-325 MG tablet **Patient would like to know if the dose can be increased**  Has the patient contacted their pharmacy? Yes   This is the patient's preferred pharmacy:  [facility] [address], [place] - [address] [place] [address] Phone: [phone] Fax: [phone]  Is this the correct pharmacy for this prescription? Yes If no, delete pharmacy and type the correct one.   Has the prescription been filled recently? No  Is the patient out of the medication? Yes  Has the patient been seen for an appointment in the last year OR does the patient have an upcoming appointment? Yes  Can we respond through MyChart? Yes  Agent: Please be advised that Rx refills may take up to 3 business days. We ask that you follow-up with your pharmacy.

## 2023-09-26 NOTE — Telephone Encounter (Unsigned)
 Copied from CRM [id number]. Topic: Medical Record Request - Other >> Sep 26, 2023  8:07 AM Suzen RAMAN wrote: Reason for CRM: Patient would like to inform provider that he will be bringing FMLA paperwork to the office for completion today or tomorrow.

## 2023-09-27 ENCOUNTER — Ambulatory Visit: Payer: Self-pay | Admitting: Family Medicine

## 2023-09-27 ENCOUNTER — Ambulatory Visit: Admitting: Family Medicine

## 2023-09-27 ENCOUNTER — Telehealth: Payer: Self-pay | Admitting: Family Medicine

## 2023-09-27 NOTE — Telephone Encounter (Signed)
 Gabapentin  script from 06/24 was sent for #90, with 3 refills. Patient states Gabapentin  does not work, he is requesting Hydrocodone  which was denied by provider on 07/28 due to patient needing a F/U. Patient presented late to visit today, was rescheduled to 07/31

## 2023-09-27 NOTE — Telephone Encounter (Signed)
 Prescription Request  09/27/2023  LOV: 08/23/2023  What is the name of the medication or equipment? gabapentin  (NEURONTIN ) 300 MG capsule   Have you contacted your pharmacy to request a refill? No   Which pharmacy would you like this sent to?  [id number] [address], [place] - [address] [place] [address] Phone: [phone] Fax: [phone]    Patient notified that their request is being sent to the clinical staff for review and that they should receive a response within 2 business days.   Please advise at Mobile [phone] (mobile)

## 2023-09-27 NOTE — Telephone Encounter (Signed)
 Copied from CRM [id number]. Topic: Clinical - Medication Refill >> Sep 26, 2023  8:04 AM Suzen RAMAN wrote: Medication: HYDROcodone -acetaminophen  (NORCO/VICODIN) 5-325 MG tablet **Patient would like to know if the dose can be increased**  Has the patient contacted their pharmacy? Yes   This is the patient's preferred pharmacy:  [facility] [address], [place] - [address] [place] [address] Phone: [phone] Fax: [phone]  Is this the correct pharmacy for this prescription? Yes If no, delete pharmacy and type the correct one.   Has the prescription been filled recently? No  Is the patient out of the medication? Yes  Has the patient been seen for an appointment in the last year OR does the patient have an upcoming appointment? Yes  Can we respond through MyChart? Yes  Agent: Please be advised that Rx refills may take up to 3 business days. We ask that you follow-up with your pharmacy.

## 2023-09-27 NOTE — Telephone Encounter (Signed)
 Spoke with patient, Patient scheduled for F/U today.

## 2023-09-27 NOTE — Progress Notes (Deleted)
   Established Patient Office Visit  Subjective:     Patient ID: Howard Hamilton, male    DOB: 1986/01/26, 38 y.o.   MRN: [id number]  No chief complaint on file.   HPI  Discussed the use of AI scribe software for clinical note transcription with the patient, who gave verbal consent to proceed.  History of Present Illness      ROS Per HPI      Objective:    There were no vitals taken for this visit.   Physical Exam Vitals and nursing note reviewed.  Constitutional:      General: He is not in acute distress.    Appearance: Normal appearance.  HENT:     Head: Normocephalic and atraumatic.     Right Ear: External ear normal.     Left Ear: External ear normal.     Nose: Nose normal.     Mouth/Throat:     Mouth: Mucous membranes are moist.     Pharynx: Oropharynx is clear.  Eyes:     Extraocular Movements: Extraocular movements intact.  Cardiovascular:     Rate and Rhythm: Normal rate and regular rhythm.     Pulses: Normal pulses.     Heart sounds: Normal heart sounds.  Pulmonary:     Effort: Pulmonary effort is normal. No respiratory distress.     Breath sounds: Normal breath sounds. No wheezing, rhonchi or rales.  Musculoskeletal:        General: Normal range of motion.     Cervical back: Normal range of motion.     Right lower leg: No edema.     Left lower leg: No edema.  Lymphadenopathy:     Cervical: No cervical adenopathy.  Skin:    General: Skin is warm and dry.  Neurological:     General: No focal deficit present.     Mental Status: He is alert and oriented to person, place, and time.  Psychiatric:        Mood and Affect: Mood normal.        Behavior: Behavior normal.     No results found for any visits on 09/27/23.  The ASCVD Risk score (Arnett DK, et al., 2019) failed to calculate for the following reasons:   The 2019 ASCVD risk score is only valid for ages 61 to 84  {Vitals History (Optional):23777}  {Show previous labs  (optional):23779}     Assessment & Plan:   Assessment and Plan Assessment & Plan      No orders of the defined types were placed in this encounter.    No orders of the defined types were placed in this encounter.   No follow-ups on file.  Corean LITTIE Ku, FNP

## 2023-09-27 NOTE — Telephone Encounter (Signed)
 Copied from CRM [id number]. Topic: Medical Record Request - Other >> Sep 26, 2023  8:07 AM Suzen RAMAN wrote: Reason for CRM: Patient would like to inform provider that he will be bringing FMLA paperwork to the office for completion today or tomorrow. >> Sep 27, 2023 10:21 AM Franky GRADE wrote: Patient would like to speak with Dr.Crawford regarding FMLA paper work, I advised that the forms can be dropped off but it will take 5-7 business days for completion. He states he can wait that long and needs to speak with Dr.Crawford.

## 2023-09-28 NOTE — Telephone Encounter (Signed)
 Patient need appointment before any medication is sent in.

## 2023-09-29 ENCOUNTER — Encounter: Payer: Self-pay | Admitting: Family Medicine

## 2023-09-29 ENCOUNTER — Ambulatory Visit: Admitting: Family Medicine

## 2023-09-29 VITALS — BP 138/80 | HR 59 | Temp 98.6°F | Ht 72.0 in | Wt 177.4 lb

## 2023-09-29 DIAGNOSIS — M545 Low back pain, unspecified: Secondary | ICD-10-CM | POA: Diagnosis not present

## 2023-09-29 DIAGNOSIS — G8929 Other chronic pain: Secondary | ICD-10-CM

## 2023-09-29 DIAGNOSIS — G5792 Unspecified mononeuropathy of left lower limb: Secondary | ICD-10-CM | POA: Diagnosis not present

## 2023-09-29 DIAGNOSIS — M79605 Pain in left leg: Secondary | ICD-10-CM | POA: Diagnosis not present

## 2023-09-29 DIAGNOSIS — M25552 Pain in left hip: Secondary | ICD-10-CM | POA: Diagnosis not present

## 2023-09-29 MED ORDER — KETOROLAC TROMETHAMINE 60 MG/2ML IM SOLN
60.0000 mg | Freq: Once | INTRAMUSCULAR | Status: AC
  Administered 2023-09-29: 60 mg via INTRAMUSCULAR

## 2023-09-29 MED ORDER — METHOCARBAMOL 500 MG PO TABS: 500.0000 mg | ORAL_TABLET | Freq: Three times a day (TID) | ORAL | 0 refills | Status: AC | PRN

## 2023-09-29 MED ORDER — HYDROCODONE-ACETAMINOPHEN 5-325 MG PO TABS
1.0000 | ORAL_TABLET | Freq: Four times a day (QID) | ORAL | 0 refills | Status: AC | PRN
Start: 2023-09-29 — End: ?

## 2023-09-29 MED ORDER — METHYLPREDNISOLONE ACETATE 40 MG/ML IJ SUSP
40.0000 mg | Freq: Once | INTRAMUSCULAR | Status: AC
  Administered 2023-09-29: 40 mg via INTRAMUSCULAR

## 2023-09-29 NOTE — Progress Notes (Signed)
 Acute Office Visit  Subjective:     Patient ID: Howard Hamilton, male    DOB: [birth year], 38 y.o.   MRN: [id number]  Chief Complaint  Patient presents with   Follow-up    HPI  38 year old male presents for follow-up of low back pain and left-sided sciatica. At last visit he was given Norco, states that this did help.  He does see [facility].  States that he was scheduled for injections, but that the injection appointment has been moved.  Inquiring about what else can be done. He has forms for us  to complete, did not bring them with him today. Reports that the pain is radiating down the left hip down the left leg and down into the left ankle. States this is keeping him up at night. States this is keeping him home from work and he has not been able to work recently. States he has 8-month-old twins and cannot take care of them due to to pain and limited mobility.  ROS Per HPI      Objective:    BP 138/80 (BP Location: Left Arm, Patient Position: Sitting)   Pulse (!) 59   Temp 98.6 F (37 C) (Temporal)   Ht 6' (1.829 m)   Wt 177 lb 6.4 oz (80.5 kg)   SpO2 96%   BMI 24.06 kg/m    Physical Exam Vitals and nursing note reviewed.  Constitutional:      General: He is not in acute distress.    Comments: Appears uncomfortable  HENT:     Head: Normocephalic and atraumatic.     Right Ear: External ear normal.     Left Ear: External ear normal.     Nose: Nose normal.     Mouth/Throat:     Mouth: Mucous membranes are moist.     Pharynx: Oropharynx is clear.  Eyes:     Extraocular Movements: Extraocular movements intact.  Cardiovascular:     Rate and Rhythm: Normal rate and regular rhythm.  Pulmonary:     Effort: Pulmonary effort is normal.  Musculoskeletal:     Cervical back: Normal range of motion.     Right lower leg: No edema.     Left lower leg: No edema.     Comments: Not able to sit comfortably, not able to tolerate low back/hip exam  through range of motion. No bruising, erythema, obvious deformity  Lymphadenopathy:     Cervical: No cervical adenopathy.  Skin:    General: Skin is warm and dry.  Neurological:     General: No focal deficit present.     Mental Status: He is alert and oriented to person, place, and time.  Psychiatric:        Mood and Affect: Mood normal.        Behavior: Behavior normal.     No results found for any visits on 09/29/23.      Assessment & Plan:   Acute bilateral low back pain without sciatica -     HYDROcodone -Acetaminophen ; Take 1 tablet by mouth every 6 (six) hours as needed for moderate pain (pain score 4-6).  Dispense: 30 tablet; Refill: 0 -     Methocarbamol ; Take 1 tablet (500 mg total) by mouth every 8 (eight) hours as needed for muscle spasms.  Dispense: 30 tablet; Refill: 0 -     Ambulatory referral to Orthopedic Surgery -     Ketorolac  Tromethamine  -     methylPREDNISolone  Acetate -  methylPREDNISolone  Acetate  Chronic left hip pain -     HYDROcodone -Acetaminophen ; Take 1 tablet by mouth every 6 (six) hours as needed for moderate pain (pain score 4-6).  Dispense: 30 tablet; Refill: 0 -     Methocarbamol ; Take 1 tablet (500 mg total) by mouth every 8 (eight) hours as needed for muscle spasms.  Dispense: 30 tablet; Refill: 0 -     Ambulatory referral to Orthopedic Surgery -     Ketorolac  Tromethamine  -     methylPREDNISolone  Acetate -     methylPREDNISolone  Acetate  Leg pain, lateral, left -     HYDROcodone -Acetaminophen ; Take 1 tablet by mouth every 6 (six) hours as needed for moderate pain (pain score 4-6).  Dispense: 30 tablet; Refill: 0 -     Methocarbamol ; Take 1 tablet (500 mg total) by mouth every 8 (eight) hours as needed for muscle spasms.  Dispense: 30 tablet; Refill: 0 -     Ambulatory referral to Orthopedic Surgery -     Ketorolac  Tromethamine  -     methylPREDNISolone  Acetate -     methylPREDNISolone  Acetate  Neuropathy of ankle, left -      HYDROcodone -Acetaminophen ; Take 1 tablet by mouth every 6 (six) hours as needed for moderate pain (pain score 4-6).  Dispense: 30 tablet; Refill: 0 -     Methocarbamol ; Take 1 tablet (500 mg total) by mouth every 8 (eight) hours as needed for muscle spasms.  Dispense: 30 tablet; Refill: 0 -     Ambulatory referral to Orthopedic Surgery -     Ketorolac  Tromethamine  -     methylPREDNISolone  Acetate -     methylPREDNISolone  Acetate     Orders Placed This Encounter  Procedures   Ambulatory referral to Orthopedic Surgery    Referral Priority:   Emergency    Referral Type:   Surgical    Referral Reason:   Specialty Services Required    Requested Specialty:   Orthopedic Surgery    Number of Visits Requested:   1     Meds ordered this encounter  Medications   HYDROcodone -acetaminophen  (NORCO/VICODIN) 5-325 MG tablet    Sig: Take 1 tablet by mouth every 6 (six) hours as needed for moderate pain (pain score 4-6).    Dispense:  30 tablet    Refill:  0   methocarbamol  (ROBAXIN ) 500 MG tablet    Sig: Take 1 tablet (500 mg total) by mouth every 8 (eight) hours as needed for muscle spasms.    Dispense:  30 tablet    Refill:  0   ketorolac  (TORADOL ) injection 60 mg   methylPREDNISolone  acetate (DEPO-MEDROL ) injection 40 mg   methylPREDNISolone  acetate (DEPO-MEDROL ) injection 40 mg    Return if symptoms worsen or fail to improve.  Corean LITTIE Ku, FNP

## 2023-09-29 NOTE — Patient Instructions (Addendum)
 Refilled Norco 5-325 for you today.   Called in methocarbamol  500mg  up to 3 times per day as needed.   I have put in an urgent referral to sports medicine.   We will try to get you in as soon as possible.

## 2023-09-30 ENCOUNTER — Encounter (HOSPITAL_BASED_OUTPATIENT_CLINIC_OR_DEPARTMENT_OTHER): Payer: Self-pay

## 2023-09-30 ENCOUNTER — Ambulatory Visit (HOSPITAL_BASED_OUTPATIENT_CLINIC_OR_DEPARTMENT_OTHER): Admitting: Physician Assistant

## 2023-10-07 ENCOUNTER — Encounter: Payer: Self-pay | Admitting: Family Medicine

## 2023-10-10 NOTE — Telephone Encounter (Signed)
 Paperwork is dropped off and given to pt MA. Please advise, Thanks

## 2023-10-13 DIAGNOSIS — M5416 Radiculopathy, lumbar region: Secondary | ICD-10-CM | POA: Diagnosis not present

## 2023-11-02 ENCOUNTER — Encounter: Payer: Self-pay | Admitting: Family Medicine

## 2023-11-13 DIAGNOSIS — M5416 Radiculopathy, lumbar region: Secondary | ICD-10-CM | POA: Insufficient documentation

## 2023-11-13 NOTE — Progress Notes (Signed)
   Established Patient Office Visit  Subjective:     Patient ID: Howard Hamilton, male    DOB: Aug 16, 1985, 38 y.o.   MRN: [id number]  Chief Complaint  Patient presents with   Follow-up    HPI  Discussed the use of AI scribe software for clinical note transcription with the patient, who gave verbal consent to proceed.  History of Present Illness Howard Hamilton is a 38 year old male who presents for follow-up regarding back pain after receiving injections.  Lumbar back pain - Significant improvement in back pain following recent injections - No severe discomfort currently - Mild residual pressure persists in the back - Prior to injections, experienced considerable pain that severely impacted daily activities and ability to work     ROS Per HPI      Objective:    BP 108/70 (BP Location: Left Arm, Patient Position: Sitting)   Pulse (!) 56   Temp 98.4 F (36.9 C) (Temporal)   Ht 6' (1.829 m)   Wt 171 lb 9.6 oz (77.8 kg)   SpO2 99%   BMI 23.27 kg/m    Physical Exam Vitals and nursing note reviewed.  Constitutional:      General: He is not in acute distress.    Appearance: Normal appearance.  HENT:     Head: Normocephalic and atraumatic.  Eyes:     Extraocular Movements: Extraocular movements intact.  Cardiovascular:     Rate and Rhythm: Normal rate and regular rhythm.  Pulmonary:     Effort: Pulmonary effort is normal.  Musculoskeletal:        General: Normal range of motion.     Cervical back: Normal range of motion.     Right lower leg: No edema.     Left lower leg: No edema.     Comments: Markedly improved gait, FROM, - SLR  Lymphadenopathy:     Cervical: No cervical adenopathy.  Skin:    General: Skin is warm and dry.  Neurological:     General: No focal deficit present.     Mental Status: He is alert and oriented to person, place, and time.  Psychiatric:        Mood and Affect: Mood normal.        Behavior: Behavior normal.            Assessment & Plan:   Assessment and Plan Assessment & Plan Chronic Lumbar Radiculopathy Significant improvement post-injections, residual pressure noted, optimal condition. - Approve return to work without restrictions. - Work note given stating return to work with no restrictions.     No orders of the defined types were placed in this encounter.    No orders of the defined types were placed in this encounter.   Return in about 3 months (around 02/13/2024) for physical.  Corean LITTIE Ku, FNP

## 2023-11-14 ENCOUNTER — Encounter: Payer: Self-pay | Admitting: Family Medicine

## 2023-11-14 ENCOUNTER — Ambulatory Visit: Admitting: Family Medicine

## 2023-11-14 VITALS — BP 108/70 | HR 56 | Temp 98.4°F | Ht 72.0 in | Wt 171.6 lb

## 2023-11-14 DIAGNOSIS — M5416 Radiculopathy, lumbar region: Secondary | ICD-10-CM | POA: Diagnosis not present

## 2023-11-14 NOTE — Patient Instructions (Signed)
 Follow up for physical over the next few months

## 2024-01-02 ENCOUNTER — Encounter: Payer: Self-pay | Admitting: Radiology

## 2024-01-17 DIAGNOSIS — M5416 Radiculopathy, lumbar region: Secondary | ICD-10-CM | POA: Diagnosis not present

## 2024-01-31 DIAGNOSIS — M5416 Radiculopathy, lumbar region: Secondary | ICD-10-CM | POA: Diagnosis not present
